# Patient Record
Sex: Female | Born: 1985 | Race: White | Hispanic: No | Marital: Single | Smoking: Former smoker
Health system: Southern US, Community
[De-identification: ages and names within clinical notes are randomized; demographics above are authoritative.]

## PROBLEM LIST (undated history)

## (undated) ENCOUNTER — Inpatient Hospital Stay (HOSPITAL_COMMUNITY): Payer: Self-pay

## (undated) DIAGNOSIS — N200 Calculus of kidney: Secondary | ICD-10-CM

## (undated) DIAGNOSIS — N390 Urinary tract infection, site not specified: Secondary | ICD-10-CM

## (undated) DIAGNOSIS — J329 Chronic sinusitis, unspecified: Secondary | ICD-10-CM

## (undated) DIAGNOSIS — K50012 Crohn's disease of small intestine with intestinal obstruction: Principal | ICD-10-CM

## (undated) DIAGNOSIS — D649 Anemia, unspecified: Secondary | ICD-10-CM

## (undated) DIAGNOSIS — G43909 Migraine, unspecified, not intractable, without status migrainosus: Secondary | ICD-10-CM

## (undated) DIAGNOSIS — I10 Essential (primary) hypertension: Secondary | ICD-10-CM

## (undated) DIAGNOSIS — F419 Anxiety disorder, unspecified: Secondary | ICD-10-CM

## (undated) DIAGNOSIS — J40 Bronchitis, not specified as acute or chronic: Secondary | ICD-10-CM

## (undated) HISTORY — PX: WISDOM TOOTH EXTRACTION: SHX21

## (undated) HISTORY — DX: Chronic sinusitis, unspecified: J40

## (undated) HISTORY — DX: Crohn's disease of small intestine with intestinal obstruction: K50.012

## (undated) HISTORY — DX: Migraine, unspecified, not intractable, without status migrainosus: G43.909

## (undated) HISTORY — DX: Chronic sinusitis, unspecified: J32.9

---

## 2006-12-24 ENCOUNTER — Ambulatory Visit (HOSPITAL_COMMUNITY): Admission: RE | Admit: 2006-12-24 | Discharge: 2006-12-24 | Payer: Self-pay | Admitting: Emergency Medicine

## 2011-03-10 ENCOUNTER — Inpatient Hospital Stay (HOSPITAL_COMMUNITY)
Admission: AD | Admit: 2011-03-10 | Discharge: 2011-03-10 | Disposition: A | Discharge status: Home / Self Care | Payer: Medicaid Other | Source: Ambulatory Visit | Attending: Obstetrics & Gynecology | Admitting: Obstetrics & Gynecology

## 2011-03-10 ENCOUNTER — Encounter (HOSPITAL_COMMUNITY): Payer: Self-pay

## 2011-03-10 DIAGNOSIS — O2390 Unspecified genitourinary tract infection in pregnancy, unspecified trimester: Secondary | ICD-10-CM | POA: Insufficient documentation

## 2011-03-10 DIAGNOSIS — R109 Unspecified abdominal pain: Secondary | ICD-10-CM | POA: Insufficient documentation

## 2011-03-10 DIAGNOSIS — A499 Bacterial infection, unspecified: Secondary | ICD-10-CM

## 2011-03-10 DIAGNOSIS — K30 Functional dyspepsia: Secondary | ICD-10-CM

## 2011-03-10 DIAGNOSIS — B9689 Other specified bacterial agents as the cause of diseases classified elsewhere: Secondary | ICD-10-CM | POA: Insufficient documentation

## 2011-03-10 DIAGNOSIS — N760 Acute vaginitis: Secondary | ICD-10-CM

## 2011-03-10 DIAGNOSIS — R1013 Epigastric pain: Secondary | ICD-10-CM

## 2011-03-10 HISTORY — DX: Anemia, unspecified: D64.9

## 2011-03-10 HISTORY — DX: Urinary tract infection, site not specified: N39.0

## 2011-03-10 LAB — URINALYSIS, ROUTINE W REFLEX MICROSCOPIC
Glucose, UA: NEGATIVE mg/dL
Ketones, ur: NEGATIVE mg/dL
Leukocytes, UA: NEGATIVE
pH: 6.5 (ref 5.0–8.0)

## 2011-03-10 LAB — URINE MICROSCOPIC-ADD ON

## 2011-03-10 LAB — WET PREP, GENITAL: Yeast Wet Prep HPF POC: NONE SEEN

## 2011-03-10 MED ORDER — GI COCKTAIL ~~LOC~~
30.0000 mL | Freq: Once | ORAL | Status: AC
  Administered 2011-03-10: 30 mL via ORAL
  Filled 2011-03-10: qty 30

## 2011-03-10 MED ORDER — METRONIDAZOLE 500 MG PO TABS: 500.0000 mg | ORAL_TABLET | Freq: Two times a day (BID) | ORAL | Status: AC

## 2011-03-10 NOTE — Progress Notes (Signed)
Patient states she started having mid to upper abdominal pain this morning that started with stretching. Comes and goes. Has not had prenatal care. No bleeding, leaking or unusual discharge.

## 2011-03-10 NOTE — ED Provider Notes (Signed)
History     Chief Complaint  Patient presents with  . Abdominal Pain   HPIJamie L Garcia is 25 y.o. G1P0 35w6dweeks presenting with epigastric, mid sternal pain X this am.  Ate cereal after pain began with nausea and vomiting. Feels that if she burped, the pain may resolve.  LMP 10/29/10,  [facility]  Very frustrated.  States they are "booked"when she calls back as instructed.  Denies any care for this pregnancy.  Wants to make sure baby is ok. 1 partner X 1 year.  Denies vaginal discharge or bleeding.      Past Medical History  Diagnosis Date  . Anemia   . Asthma     exercise induced  . Urinary tract infection   . Chronic kidney disease     kidney stones    Past Surgical History  Procedure Date  . No past surgeries     Family History  Problem Relation Age of Onset  . Anesthesia problems Neg Hx     History  Substance Use Topics  . Smoking status: Former SResearch scientist (life sciences) . Smokeless tobacco: Never Used  . Alcohol Use: No    Allergies: No Known Allergies  Prescriptions prior to admission  Medication Sig Dispense Refill  . acetaminophen (TYLENOL) 500 MG tablet Take 1,000 mg by mouth every 6 (six) hours as needed. Takes for headaches       . prenatal vitamin w/FE, FA (PRENATAL 1 + 1) 27-1 MG TABS Take 1 tablet by mouth daily.          Review of Systems  Gastrointestinal: Positive for heartburn and abdominal pain (mid sternal ). Negative for nausea, vomiting, diarrhea and constipation.  Genitourinary: Negative.        Negative for vaginal bleeding or discharge   Physical Exam   Blood pressure 126/68, pulse 93, temperature 98.4 F (36.9 C), temperature source Oral, resp. rate 16, height 5' 5.5" (1.664 m), weight 192 lb 3.2 oz (87.181 kg), last menstrual period 10/29/2010, SpO2 99.00%.  Physical Exam  Constitutional: She is oriented to person, place, and time. She appears well-developed and well-nourished. No distress.  HENT:   Head: Normocephalic.  Neck: Normal range of motion.  Cardiovascular: Normal rate.   Respiratory: Effort normal.  GI: Soft. She exhibits no distension. There is tenderness (epigastric). There is no rebound and no guarding.       Uterus-18 week size, nontender  Genitourinary: Uterus is enlarged (18 week size). Uterus is not tender. Cervix exhibits no motion tenderness, no discharge and no friability. No bleeding around the vagina. Vaginal discharge (malodorous, yellow in color) found.  Neurological: She is alert and oriented to person, place, and time.  Skin: Skin is warm and dry.   Results for orders placed during the hospital encounter of 03/10/11 (from the past 24 hour(s))  URINALYSIS, ROUTINE W REFLEX MICROSCOPIC     Status: Abnormal   Collection Time   03/10/11 10:20 AM      Component Value Range   Color, Urine YELLOW  YELLOW    APPearance CLOUDY (*) CLEAR    Specific Gravity, Urine 1.010  1.005 - 1.030    pH 6.5  5.0 - 8.0    Glucose, UA NEGATIVE  NEGATIVE (mg/dL)   Hgb urine dipstick TRACE (*) NEGATIVE    Bilirubin Urine NEGATIVE  NEGATIVE    Ketones, ur NEGATIVE  NEGATIVE (mg/dL)   Protein, ur NEGATIVE  NEGATIVE (mg/dL)  Urobilinogen, UA 0.2  0.0 - 1.0 (mg/dL)   Nitrite NEGATIVE  NEGATIVE    Leukocytes, UA NEGATIVE  NEGATIVE   URINE MICROSCOPIC-ADD ON     Status: Abnormal   Collection Time   03/10/11 10:20 AM      Component Value Range   Squamous Epithelial / LPF RARE  RARE    WBC, UA 0-2  <3 (WBC/hpf)   Bacteria, UA FEW (*) RARE    Urine-Other AMORPHOUS URATES/PHOSPHATES    WET PREP, GENITAL     Status: Abnormal   Collection Time   03/10/11 10:52 AM      Component Value Range   Yeast, Wet Prep NONE SEEN  NONE SEEN    Trich, Wet Prep NONE SEEN  NONE SEEN    Clue Cells, Wet Prep FEW (*) NONE SEEN    WBC, Wet Prep HPF POC TOO NUMEROUS TO COUNT (*) NONE SEEN    MAU Course  Procedures GC/CHL culture to lab GI cocktail ordered  Patient states pain is much improved  after GI cocktail.   Assessment and Plan  A:  Upper abdominal pain at 18w gestation      Bacterial vaginosis      Indigestion P:  Rx for Flagyl    Instructions to avoid fried, greasy foods.    Call [facility] to make appt for prenatal care. Kashtyn Jankowski,EVE M 03/10/2011, 10:45 AM   Belinda Fisher, NP 03/10/11 1216

## 2011-03-11 LAB — GC/CHLAMYDIA PROBE AMP, GENITAL: GC Probe Amp, Genital: NEGATIVE

## 2011-04-05 NOTE — L&D Delivery Note (Signed)
Delivery Note At 9:47 PM a viable female was delivered via Vaginal, Spontaneous Delivery (Presentation: Right Occiput Anterior).  APGAR: 9, 10; weight .   Placenta status: Intact, Spontaneous.  Cord: 3 vessels with the following complications: None.  Cord pH: NA  Nuchal cord x1  Anesthesia: Lidocaine, local Episiotomy: None Lacerations: 2nd degree Suture Repair: 3.0 Monocryl Est. Blood Loss (mL): 300  Mom to postpartum.  Baby to nursery-stable.  [place] 07/30/2011, 10:27 PM    I was present for delivery and agree with note above. [facility]

## 2011-04-18 ENCOUNTER — Other Ambulatory Visit (HOSPITAL_COMMUNITY): Payer: Self-pay | Admitting: Physician Assistant

## 2011-04-18 DIAGNOSIS — Z3689 Encounter for other specified antenatal screening: Secondary | ICD-10-CM

## 2011-04-21 ENCOUNTER — Ambulatory Visit (HOSPITAL_COMMUNITY)
Admission: RE | Admit: 2011-04-21 | Discharge: 2011-04-21 | Disposition: A | Discharge status: Home / Self Care | Payer: Medicaid Other | Source: Ambulatory Visit | Attending: Physician Assistant | Admitting: Physician Assistant

## 2011-04-21 DIAGNOSIS — O0930 Supervision of pregnancy with insufficient antenatal care, unspecified trimester: Secondary | ICD-10-CM | POA: Insufficient documentation

## 2011-04-21 DIAGNOSIS — Z1389 Encounter for screening for other disorder: Secondary | ICD-10-CM | POA: Insufficient documentation

## 2011-04-21 DIAGNOSIS — O358XX0 Maternal care for other (suspected) fetal abnormality and damage, not applicable or unspecified: Secondary | ICD-10-CM | POA: Insufficient documentation

## 2011-04-21 DIAGNOSIS — Z3689 Encounter for other specified antenatal screening: Secondary | ICD-10-CM

## 2011-04-21 DIAGNOSIS — Z363 Encounter for antenatal screening for malformations: Secondary | ICD-10-CM | POA: Insufficient documentation

## 2011-05-21 ENCOUNTER — Inpatient Hospital Stay (HOSPITAL_COMMUNITY)
Admission: AD | Admit: 2011-05-21 | Discharge: 2011-05-21 | Disposition: A | Discharge status: Home / Self Care | Payer: Medicaid Other | Source: Ambulatory Visit | Attending: Obstetrics & Gynecology | Admitting: Obstetrics & Gynecology

## 2011-05-21 ENCOUNTER — Encounter (HOSPITAL_COMMUNITY): Payer: Self-pay | Admitting: *Deleted

## 2011-05-21 DIAGNOSIS — B9689 Other specified bacterial agents as the cause of diseases classified elsewhere: Secondary | ICD-10-CM | POA: Insufficient documentation

## 2011-05-21 DIAGNOSIS — O479 False labor, unspecified: Secondary | ICD-10-CM

## 2011-05-21 DIAGNOSIS — N760 Acute vaginitis: Secondary | ICD-10-CM

## 2011-05-21 DIAGNOSIS — O4690 Antepartum hemorrhage, unspecified, unspecified trimester: Secondary | ICD-10-CM | POA: Insufficient documentation

## 2011-05-21 DIAGNOSIS — A499 Bacterial infection, unspecified: Secondary | ICD-10-CM | POA: Insufficient documentation

## 2011-05-21 DIAGNOSIS — O2390 Unspecified genitourinary tract infection in pregnancy, unspecified trimester: Secondary | ICD-10-CM | POA: Insufficient documentation

## 2011-05-21 HISTORY — DX: Calculus of kidney: N20.0

## 2011-05-21 LAB — URINALYSIS, ROUTINE W REFLEX MICROSCOPIC
Bilirubin Urine: NEGATIVE
Nitrite: NEGATIVE
Protein, ur: NEGATIVE mg/dL
Urobilinogen, UA: 0.2 mg/dL (ref 0.0–1.0)

## 2011-05-21 MED ORDER — METRONIDAZOLE 0.75 % EX GEL: Freq: Every day | CUTANEOUS | Status: DC

## 2011-05-21 NOTE — ED Provider Notes (Signed)
Attestation of Attending Supervision of Advanced Practitioner: Evaluation and management procedures were performed by the PA/NP/CNM/OB Fellow under my supervision/collaboration. Chart reviewed, and agree with management and plan.  Verita Schneiders, M.D. 05/21/2011 6:03 PM

## 2011-05-21 NOTE — Progress Notes (Signed)
Pt stated she had some pink/blood in her urine this morning x2. Nothing since. Denies pain or discomfort.

## 2011-05-21 NOTE — Progress Notes (Signed)
Saw blood in toilet.

## 2011-05-21 NOTE — ED Provider Notes (Signed)
Chief Complaint:  Vaginal Bleeding   Jocelyn Garcia is  26 y.o. G1P0 at 65w2dpresents complaining of Vaginal Bleeding .  She states none contractions., intact membranes, along with active fetal movement. She saw pink in the toilett after urinating   Obstetrical/Gynecological History: Menstrual History: OB History    Grav Para Term Preterm Abortions TAB SAB Ect Mult Living   1               Patient's last menstrual period was 10/29/2010.     Past Medical History: Past Medical History  Diagnosis Date  . Anemia   . Asthma     exercise induced  . Urinary tract infection   . Chronic kidney disease   . Kidney stones     Past Surgical History: Past Surgical History  Procedure Date  . Wisdom tooth extraction     Family History: Family History  Problem Relation Age of Onset  . Anesthesia problems Neg Hx     Social History: History  Substance Use Topics  . Smoking status: Former SResearch scientist (life sciences) . Smokeless tobacco: Never Used  . Alcohol Use: No    Allergies: No Known Allergies  Meds:  Prescriptions prior to admission  Medication Sig Dispense Refill  . acetaminophen (TYLENOL) 500 MG tablet Take 1,000 mg by mouth every 6 (six) hours as needed. Takes for headaches       . albuterol (PROVENTIL HFA;VENTOLIN HFA) 108 (90 BASE) MCG/ACT inhaler Inhale 2 puffs into the lungs every 6 (six) hours as needed. For shortness of breath      . calcium carbonate (TUMS EX) 750 MG chewable tablet Chew 1 tablet by mouth daily. For heartburn      . prenatal vitamin w/FE, FA (PRENATAL 1 + 1) 27-1 MG TABS Take 1 tablet by mouth daily.          Review of Systems - Please refer to the aforementioned patients' reports.     Physical Exam  Blood pressure 128/73, pulse 96, temperature 99 F (37.2 C), temperature source Oral, resp. rate 18, height 5' 5"  (1.651 m), weight 217 lb (98.431 kg), last menstrual period 10/29/2010. GENERAL: Well-developed, well-nourished female in no acute distress.    LUNGS: Clear to auscultation bilaterally.  HEART: Regular rate and rhythm. ABDOMEN: Soft, nontender, nondistended, gravid.  EXTREMITIES: Nontender, no edema, 2+ distal pulses. CERVICAL EXAM: Dilatation 0cm   Effacement 0%   Station -3   Presentation: unsure SSE:  No blood in vagina. Thin white discharger.  Cx very friable.  Wet prep  + clue cells FHT:  Baseline rate 140 bpm   Variability moderate  Accelerations present   Decelerations none Contractions: Every 0 mins   Labs: Recent Results (from the past 24 hour(s))  URINALYSIS, ROUTINE W REFLEX MICROSCOPIC   Collection Time   05/21/11  3:30 PM      Component Value Range   Color, Urine YELLOW  YELLOW    APPearance CLEAR  CLEAR    Specific Gravity, Urine <1.005 (*) 1.005 - 1.030    pH 6.0  5.0 - 8.0    Glucose, UA NEGATIVE  NEGATIVE (mg/dL)   Hgb urine dipstick LARGE (*) NEGATIVE    Bilirubin Urine NEGATIVE  NEGATIVE    Ketones, ur NEGATIVE  NEGATIVE (mg/dL)   Protein, ur NEGATIVE  NEGATIVE (mg/dL)   Urobilinogen, UA 0.2  0.0 - 1.0 (mg/dL)   Nitrite NEGATIVE  NEGATIVE    Leukocytes, UA SMALL (*) NEGATIVE   URINE MICROSCOPIC-ADD ON  Collection Time   05/21/11  3:30 PM      Component Value Range   Squamous Epithelial / LPF RARE  RARE    WBC, UA 3-6  <3 (WBC/hpf)   RBC / HPF 7-10  <3 (RBC/hpf)   Bacteria, UA FEW (*) RARE    Imaging Studies:  No results found.  Assessment: [name] is  26 y.o. G1P0 at 63w2dpresents with cervical friability 2/2  Bacterial vaginosis.  Plan: Treat BV  CRESENZO-DISHMAN,Valkyrie Guardiola 2/16/20135:13 PM

## 2011-05-21 NOTE — Discharge Instructions (Signed)
Bacterial Vaginosis Bacterial vaginosis (BV) is a vaginal infection where the normal balance of bacteria in the vagina is disrupted. The normal balance is then replaced by an overgrowth of certain bacteria. There are several different kinds of bacteria that can cause BV. BV is the most common vaginal infection in women of childbearing age. CAUSES   The cause of BV is not fully understood. BV develops when there is an increase or imbalance of harmful bacteria.   Some activities or behaviors can upset the normal balance of bacteria in the vagina and put women at increased risk including:   Having a new sex partner or multiple sex partners.   Douching.   Using an intrauterine device (IUD) for contraception.   It is not clear what role sexual activity plays in the development of BV. However, women that have never had sexual intercourse are rarely infected with BV.  Women do not get BV from toilet seats, bedding, swimming pools or from touching objects around them.  SYMPTOMS   Grey vaginal discharge.   A fish-like odor with discharge, especially after sexual intercourse.   Itching or burning of the vagina and vulva.   Burning or pain with urination.   Some women have no signs or symptoms at all.  DIAGNOSIS  Your caregiver must examine the vagina for signs of BV. Your caregiver will perform lab tests and look at the sample of vaginal fluid through a microscope. They will look for bacteria and abnormal cells (clue cells), a pH test higher than 4.5, and a positive amine test all associated with BV.  RISKS AND COMPLICATIONS   Pelvic inflammatory disease (PID).   Infections following gynecology surgery.   Developing HIV.   Developing herpes virus.  TREATMENT  Sometimes BV will clear up without treatment. However, all women with symptoms of BV should be treated to avoid complications, especially if gynecology surgery is planned. Female partners generally do not need to be treated. However,  BV may spread between female sex partners so treatment is helpful in preventing a recurrence of BV.   BV may be treated with antibiotics. The antibiotics come in either pill or vaginal cream forms. Either can be used with nonpregnant or pregnant women, but the recommended dosages differ. These antibiotics are not harmful to the baby.   BV can recur after treatment. If this happens, a second round of antibiotics will often be prescribed.   Treatment is important for pregnant women. If not treated, BV can cause a premature delivery, especially for a pregnant woman who had a premature birth in the past. All pregnant women who have symptoms of BV should be checked and treated.   For chronic reoccurrence of BV, treatment with a type of prescribed gel vaginally twice a week is helpful.  HOME CARE INSTRUCTIONS   Finish all medication as directed by your caregiver.   Do not have sex until treatment is completed.   Tell your sexual partner that you have a vaginal infection. They should see their caregiver and be treated if they have problems, such as a mild rash or itching.   Practice safe sex. Use condoms. Only have 1 sex partner.  PREVENTION  Basic prevention steps can help reduce the risk of upsetting the natural balance of bacteria in the vagina and developing BV:  Do not have sexual intercourse (be abstinent).   Do not douche.   Use all of the medicine prescribed for treatment of BV, even if the signs and symptoms go away.  Tell your sex partner if you have BV. That way, they can be treated, if needed, to prevent reoccurrence.  SEEK MEDICAL CARE IF:   Your symptoms are not improving after 3 days of treatment.   You have increased discharge, pain, or fever.  MAKE SURE YOU:   Understand these instructions.   Will watch your condition.   Will get help right away if you are not doing well or get worse.  FOR MORE INFORMATION  Division of STD Prevention (DSTDP), Centers for Disease  Control and Prevention: [employer] [place] (ASHA): www.ashastd.org  Document Released: 03/21/2005 Document Revised: 12/01/2010 Document Reviewed: 09/11/2008 [facility] Patient Information 2012 [place].

## 2011-05-23 LAB — URINE CULTURE
Colony Count: 25000
Special Requests: NORMAL

## 2011-06-22 ENCOUNTER — Other Ambulatory Visit (HOSPITAL_COMMUNITY): Payer: Self-pay | Admitting: Physician Assistant

## 2011-06-22 DIAGNOSIS — R319 Hematuria, unspecified: Secondary | ICD-10-CM

## 2011-06-24 ENCOUNTER — Ambulatory Visit (HOSPITAL_COMMUNITY)
Admission: RE | Admit: 2011-06-24 | Discharge: 2011-06-24 | Disposition: A | Discharge status: Home / Self Care | Payer: Medicaid Other | Source: Ambulatory Visit | Attending: Physician Assistant | Admitting: Physician Assistant

## 2011-06-24 DIAGNOSIS — O99891 Other specified diseases and conditions complicating pregnancy: Secondary | ICD-10-CM | POA: Insufficient documentation

## 2011-06-24 DIAGNOSIS — R319 Hematuria, unspecified: Secondary | ICD-10-CM | POA: Insufficient documentation

## 2011-07-16 HISTORY — PX: CT CTA CORONARY W/CA SCORE W/CM &/OR WO/CM: HXRAD787

## 2011-07-29 ENCOUNTER — Inpatient Hospital Stay (HOSPITAL_COMMUNITY)
Admission: AD | Admit: 2011-07-29 | Discharge: 2011-08-01 | DRG: 775 | Disposition: A | Discharge status: Home / Self Care | Payer: Medicaid Other | Source: Ambulatory Visit | Attending: Obstetrics & Gynecology | Admitting: Obstetrics & Gynecology

## 2011-07-29 ENCOUNTER — Encounter (HOSPITAL_COMMUNITY): Payer: Self-pay | Admitting: *Deleted

## 2011-07-29 DIAGNOSIS — O139 Gestational [pregnancy-induced] hypertension without significant proteinuria, unspecified trimester: Principal | ICD-10-CM | POA: Diagnosis present

## 2011-07-29 LAB — PROTEIN / CREATININE RATIO, URINE: Protein Creatinine Ratio: 0.11 (ref 0.00–0.15)

## 2011-07-29 LAB — COMPREHENSIVE METABOLIC PANEL
ALT: 12 U/L (ref 0–35)
AST: 10 U/L (ref 0–37)
Albumin: 2.8 g/dL — ABNORMAL LOW (ref 3.5–5.2)
Calcium: 9.1 mg/dL (ref 8.4–10.5)
Sodium: 138 mEq/L (ref 135–145)
Total Protein: 6.3 g/dL (ref 6.0–8.3)

## 2011-07-29 LAB — CBC
MCH: 27.9 pg (ref 26.0–34.0)
MCHC: 32.4 g/dL (ref 30.0–36.0)
Platelets: 204 10*3/uL (ref 150–400)

## 2011-07-29 LAB — OB RESULTS CONSOLE HIV ANTIBODY (ROUTINE TESTING): HIV: NONREACTIVE

## 2011-07-29 LAB — OB RESULTS CONSOLE GBS: GBS: NEGATIVE

## 2011-07-29 LAB — OB RESULTS CONSOLE RPR: RPR: NONREACTIVE

## 2011-07-29 LAB — OB RESULTS CONSOLE GC/CHLAMYDIA: Gonorrhea: NEGATIVE

## 2011-07-29 MED ORDER — LACTATED RINGERS IV SOLN
500.0000 mL | INTRAVENOUS | Status: DC | PRN
  Administered 2011-07-29: 1000 mL via INTRAVENOUS

## 2011-07-29 MED ORDER — FLEET ENEMA 7-19 GM/118ML RE ENEM: 1.0000 | ENEMA | RECTAL | Status: DC | PRN

## 2011-07-29 MED ORDER — MISOPROSTOL 25 MCG QUARTER TABLET
25.0000 ug | ORAL_TABLET | ORAL | Status: DC | PRN
  Administered 2011-07-29 – 2011-07-30 (×4): 25 ug via VAGINAL
  Filled 2011-07-29 (×4): qty 0.25

## 2011-07-29 MED ORDER — OXYTOCIN BOLUS FROM INFUSION
500.0000 mL | Freq: Once | INTRAVENOUS | Status: DC
  Filled 2011-07-29: qty 500

## 2011-07-29 MED ORDER — ZOLPIDEM TARTRATE 10 MG PO TABS
10.0000 mg | ORAL_TABLET | Freq: Every evening | ORAL | Status: DC | PRN
  Administered 2011-07-29: 10 mg via ORAL
  Filled 2011-07-29: qty 1

## 2011-07-29 MED ORDER — LABETALOL HCL 200 MG PO TABS
200.0000 mg | ORAL_TABLET | Freq: Two times a day (BID) | ORAL | Status: DC
  Administered 2011-07-29 – 2011-07-30 (×3): 200 mg via ORAL
  Filled 2011-07-29 (×5): qty 1

## 2011-07-29 MED ORDER — TERBUTALINE SULFATE 1 MG/ML IJ SOLN: 0.2500 mg | Freq: Once | INTRAMUSCULAR | Status: AC | PRN

## 2011-07-29 MED ORDER — LIDOCAINE HCL (PF) 1 % IJ SOLN
30.0000 mL | INTRAMUSCULAR | Status: DC | PRN
  Administered 2011-07-30: 30 mL via SUBCUTANEOUS
  Filled 2011-07-29: qty 30

## 2011-07-29 MED ORDER — ZOLPIDEM TARTRATE 5 MG PO TABS: 5.0000 mg | ORAL_TABLET | Freq: Every evening | ORAL | Status: DC | PRN

## 2011-07-29 MED ORDER — CITRIC ACID-SODIUM CITRATE 334-500 MG/5ML PO SOLN
30.0000 mL | ORAL | Status: DC | PRN
  Administered 2011-07-29: 30 mL via ORAL
  Filled 2011-07-29: qty 15

## 2011-07-29 MED ORDER — LABETALOL HCL 5 MG/ML IV SOLN
10.0000 mg | INTRAVENOUS | Status: DC | PRN
  Administered 2011-07-29: 10 mg via INTRAVENOUS
  Filled 2011-07-29: qty 4

## 2011-07-29 MED ORDER — ACETAMINOPHEN 325 MG PO TABS: 650.0000 mg | ORAL_TABLET | ORAL | Status: DC | PRN

## 2011-07-29 MED ORDER — IBUPROFEN 600 MG PO TABS: 600.0000 mg | ORAL_TABLET | Freq: Four times a day (QID) | ORAL | Status: DC | PRN

## 2011-07-29 MED ORDER — OXYCODONE-ACETAMINOPHEN 5-325 MG PO TABS: 1.0000 | ORAL_TABLET | ORAL | Status: DC | PRN

## 2011-07-29 MED ORDER — ONDANSETRON HCL 4 MG/2ML IJ SOLN
4.0000 mg | Freq: Four times a day (QID) | INTRAMUSCULAR | Status: DC | PRN
  Administered 2011-07-30 (×2): 4 mg via INTRAVENOUS
  Filled 2011-07-29 (×2): qty 2

## 2011-07-29 MED ORDER — LABETALOL HCL 5 MG/ML IV SOLN: 10.0000 mg | INTRAVENOUS | Status: DC | PRN

## 2011-07-29 MED ORDER — OXYTOCIN 20 UNITS IN LACTATED RINGERS INFUSION - SIMPLE
125.0000 mL/h | Freq: Once | INTRAVENOUS | Status: AC
  Administered 2011-07-30: 125 mL/h via INTRAVENOUS

## 2011-07-29 MED ORDER — LACTATED RINGERS IV SOLN
INTRAVENOUS | Status: DC
  Administered 2011-07-30 (×3): via INTRAVENOUS

## 2011-07-29 NOTE — Progress Notes (Addendum)
AVALIN BRILEY is a 26 y.o. G1P0 at [redacted]w[redacted]d  Subjective: Doing well. No complaints  Objective: BP 138/71  Pulse 84  Temp(Src) 98.1 F (36.7 C) (Oral)  Resp 18  Ht 5' 5"  (1.651 m)  Wt 99.791 kg (220 lb)  BMI 36.61 kg/m2  LMP 10/29/2010      FHT:  FHR: 140s bpm, variability: moderate,  accelerations:  Present,  decelerations:  Absent UC:   irregular, every 8 minutes SVE:   Dilation: 2 Effacement (%): 30 Station: -3 Exam by:: Nneka Blanda MD  Labs: Lab Results  Component Value Date   WBC 10.1 07/29/2011   HGB 9.4* 07/29/2011   HCT 29.0* 07/29/2011   MCV 86.1 07/29/2011   PLT 204 07/29/2011    Assessment / Plan: Induction of labor due to gestational hypertension,  progressing well on pitocin Continue cytotec. Foley bulb placed  Labor: Progressing normally Fetal Wellbeing:  Category I Pain Control:  Labor support without medications I/D:  n/a Anticipated MOD:  NSVD  Jaizon Deroos 07/29/2011, 11:06 PM

## 2011-07-29 NOTE — Progress Notes (Signed)
Jocelyn Garcia is a 26 y.o. G1P0 at 61w1dby ultrasound admitted for induction of labor due to Hypertension.  Subjective: No complaints. Does not want epidural. Would like to avoid pitocin - worried about contractions.  Objective: BP 138/71  Pulse 84  Temp(Src) 98.1 F (36.7 C) (Oral)  Resp 20  Ht 5' 5"  (1.651 m)  Wt 99.791 kg (220 lb)  BMI 36.61 kg/m2  LMP 10/29/2010      FHT:  FHR: 140 bpm, variability: moderate,  accelerations:  Present,  decelerations:  Absent UC:   regular, every 4 minutes SVE:   Dilation: 1 Effacement (%): 20 Station: -3 Exam by:: Richar Dunklee,MD  Labs: Lab Results  Component Value Date   WBC 10.1 07/29/2011   HGB 9.4* 07/29/2011   HCT 29.0* 07/29/2011   MCV 86.1 07/29/2011   PLT 204 07/29/2011    Assessment / Plan: Induction of labor due to gestational hypertension,  progressing well on pitocin  Labor: Progressing normally s/p cytotecx2 Preeclampsia:  no signs or symptoms of toxicity Fetal Wellbeing:  Category I Pain Control:  Labor support without medications I/D:  n/a Anticipated MOD:  NSVD  Jocelyn Scantling MD 07/29/2011, 7:42 PM

## 2011-07-29 NOTE — H&P (Signed)
Agree with above note.  Jocelyn Blow H. 07/29/2011 9:35 PM

## 2011-07-29 NOTE — H&P (Signed)
Jocelyn Garcia is a 26 y.o. female presenting with HTN @ 40.1 weeks for induction Maternal Medical History:  Reason for admission: HTN   Contractions: Frequency: rare.   Perceived severity is mild.    Fetal activity: Perceived fetal activity is normal.   Last perceived fetal movement was within the past hour.    Prenatal complications: Hypertension.   Prenatal Complications - Diabetes: none.    OB History    Grav Para Term Preterm Abortions TAB SAB Ect Mult Living   1              Past Medical History  Diagnosis Date  . Anemia   . Asthma     exercise induced  . Urinary tract infection   . Chronic kidney disease   . Kidney stones    Past Surgical History  Procedure Date  . Wisdom tooth extraction    Family History: family history is negative for Anesthesia problems. Social History:  reports that she has quit smoking. She has never used smokeless tobacco. She reports that she uses illicit drugs (Marijuana). She reports that she does not drink alcohol.  ROS Pertinent items are noted in HPI.  Dilation: 1 Effacement (%): 20 Station: -3 Exam by:: Caden Fatica,MD Blood pressure 146/90, pulse 83, temperature 98.1 F (36.7 C), temperature source Oral, resp. rate 18, height 5' 5"  (1.651 m), weight 99.791 kg (220 lb), last menstrual period 10/29/2010. Exam Physical Exam  General: pleasant c/f, nad, relaxed.  Neurologic exam : Cn 2-7 intact Left radial reflex 1+  Neck:  No deformities, thyromegaly, masses, or tenderness noted.   Supple with full range of motion without pain. Lungs:  Normal respiratory effort, chest expands symmetrically. Lungs are clear to auscultation, no crackles or wheezes. Heart - Regular rate and rhythm.  No murmurs, gallops or rubs.    Abdomen: soft and non-tender without masses, organomegaly or hernias noted.  No guarding or rebound Extremities:   Non-tender, No cyanosis, edema, or deformity noted.  Dilation: 1 Effacement (%): 20 Station:  -3 Presentation: Vertex Exam by:: Tijah Hane,MD Intact  Prenatal labs: ABO, Rh: A/Positive/-- (04/26 1254) Antibody: Negative (04/26 1254) Rubella: Immune (04/26 1254) RPR: Nonreactive (04/26 1254)  HBsAg: Negative (04/26 1254)  HIV: Non-reactive (04/26 1254)  GBS: Negative (04/26 1254)   Assessment/Plan: 26 y/o c/f G1P0 @ 40.1 with HTN  1. Infuction of labor for HTN BP Readings from Last 3 Encounters:  07/29/11 146/90  05/21/11 128/73  03/10/11 129/65  no right upper quadrant pain. Reflexes normal. No visual changes.  -will start with cervical ripening with cytotec 25 transvaginally.  Does not want epidural or pitocin.  Lyndee Hensen MD 07/29/2011, 2:00 PM

## 2011-07-30 ENCOUNTER — Encounter (HOSPITAL_COMMUNITY): Payer: Self-pay | Admitting: *Deleted

## 2011-07-30 DIAGNOSIS — O139 Gestational [pregnancy-induced] hypertension without significant proteinuria, unspecified trimester: Secondary | ICD-10-CM

## 2011-07-30 LAB — RPR: RPR Ser Ql: NONREACTIVE

## 2011-07-30 MED ORDER — BUTORPHANOL TARTRATE 2 MG/ML IJ SOLN
1.0000 mg | INTRAMUSCULAR | Status: DC | PRN
  Administered 2011-07-30 (×2): 1 mg via INTRAVENOUS
  Filled 2011-07-30 (×2): qty 1

## 2011-07-30 MED ORDER — LABETALOL HCL 5 MG/ML IV SOLN: 10.0000 mg | Freq: Once | INTRAVENOUS | Status: DC

## 2011-07-30 MED ORDER — OXYTOCIN 20 UNITS IN LACTATED RINGERS INFUSION - SIMPLE
1.0000 m[IU]/min | INTRAVENOUS | Status: DC
  Administered 2011-07-30: 2 m[IU]/min via INTRAVENOUS
  Filled 2011-07-30: qty 1000

## 2011-07-30 MED ORDER — TERBUTALINE SULFATE 1 MG/ML IJ SOLN: 0.2500 mg | Freq: Once | INTRAMUSCULAR | Status: AC | PRN

## 2011-07-30 NOTE — Progress Notes (Addendum)
RN updated Rogue Jury, CNM of patient's labor status: tachystole, contraction pattern, patient's pain level, and fetal heart baseline, variability, and accelerations. No orders given at this time. CNM instructed to keep pitocin at 92mllitunits/minute. RN will continue to order.

## 2011-07-30 NOTE — Progress Notes (Signed)
Tracing maternal

## 2011-07-30 NOTE — Progress Notes (Addendum)
Tracing maternal with patient on her right side.

## 2011-07-30 NOTE — Progress Notes (Signed)
   Subjective: Pt reports increased pain with contractions.  Denies headache or epigastric pain.  No questions or concerns.   Objective: BP 129/71  Pulse 78  Temp(Src) 97.4 F (36.3 C) (Oral)  Resp 22  Ht 5' 5"  (1.651 m)  Wt 99.791 kg (220 lb)  BMI 36.61 kg/m2  LMP 10/29/2010      FHT:  FHR: 130's bpm, variability: moderate,  accelerations:  Present,  decelerations:  Present variable. UC:   regular, every 2-3 minutes SVE:   Dilation: 4.5 Effacement (%): 60 Station: -1 Exam by:: Sinda Du, RN  Labs: Lab Results  Component Value Date   WBC 10.1 07/29/2011   HGB 9.4* 07/29/2011   HCT 29.0* 07/29/2011   MCV 86.1 07/29/2011   PLT 204 07/29/2011    Assessment / Plan: Induction of labor due to gestational hypertension,  progressing well on pitocin  Labor: Cervix remains unchanged. Preeclampsia:  labs stable Fetal Wellbeing:  Category II Pain Control:  Labor support without medications I/D:  n/a Anticipated MOD:  NSVD Continue Pitocin  [facility] 07/30/2011, 12:15 PM

## 2011-07-30 NOTE — Progress Notes (Signed)
   Subjective: Nurse reports spontaneous rupture of membranes, thin meconium; pt reports increased pain with contractions.    Objective: BP 169/89  Pulse 72  Temp(Src) 98 F (36.7 C) (Oral)  Resp 24  Ht 5' 5"  (1.651 m)  Wt 99.791 kg (220 lb)  BMI 36.61 kg/m2  LMP 10/29/2010      FHT:  FHR: 130's bpm, variability: moderate,  accelerations:  Present,  decelerations:  Present variables UC:   regular, every 1-3 minutes SVE:   Dilation: 6 Effacement (%): 80 Station: 0 Exam by:: Montey Hora, RN  Labs: Lab Results  Component Value Date   WBC 10.1 07/29/2011   HGB 9.4* 07/29/2011   HCT 29.0* 07/29/2011   MCV 86.1 07/29/2011   PLT 204 07/29/2011    Assessment / Plan: Augmentation of labor, progressing well  Labor: Progressing normally Preeclampsia:  labs stable Fetal Wellbeing:  Category II Pain Control:  Labor support without medications I/D:  n/a Anticipated MOD:  NSVD  [facility] 07/30/2011, 7:10 PM

## 2011-07-30 NOTE — Progress Notes (Signed)
Jocelyn Garcia is a 26 y.o. G1P0 at [redacted]w[redacted]d  Subjective: Doing well. Sleeping comfortably. Foley bulb came out.   Objective: BP 125/74  Pulse 90  Temp(Src) 98 F (36.7 C) (Oral)  Resp 14  Ht 5' 5"  (1.651 m)  Wt 99.791 kg (220 lb)  BMI 36.61 kg/m2  LMP 10/29/2010      FHT:  FHR: 150s bpm, variability: moderate,  accelerations:  Present,  decelerations:  Absent UC:   irregular, every 3-5 minutes  SVE:   Dilation: 4.5 Effacement (%): 60 Station: -3 Exam by:: CSinda Du RN  Labs: Lab Results  Component Value Date   WBC 10.1 07/29/2011   HGB 9.4* 07/29/2011   HCT 29.0* 07/29/2011   MCV 86.1 07/29/2011   PLT 204 07/29/2011    Assessment / Plan: Induction of labor due to gestational hypertension,  progressing well. Continue w/ Cytotec  Labor: Progressing normally Fetal Wellbeing:  Category I Pain Control:  Labor support without medications I/D:  n/a Anticipated MOD:  NSVD  [name] 07/30/2011, 3:17 AM

## 2011-07-30 NOTE — Progress Notes (Signed)
RN talked to Jocelyn Garcia, CNM about patient's labor status. RN notified her of frequent contraction pattern and pitocin at 4 milliunits/minute. No orders given at this time. RN will continue to monitor.

## 2011-07-30 NOTE — Progress Notes (Signed)
RN and Mora Appl, CNM discussed patient's plan of care. CNM instructed RN to continue increasing pitocin.

## 2011-07-30 NOTE — Progress Notes (Signed)
Jocelyn Garcia is a 26 y.o. G1P0 at [redacted]w[redacted]d  Subjective: No complaints.   Objective: BP 123/63  Pulse 84  Temp(Src) 98.1 F (36.7 C) (Oral)  Resp 22  Ht 5' 5"  (1.651 m)  Wt 99.791 kg (220 lb)  BMI 36.61 kg/m2  LMP 10/29/2010      FHT:  FHR: 140s bpm, variability: moderate,  accelerations:  Present,  decelerations:  Absent UC:   irregular, every 4-6 minutes  SVE:   Dilation: 4.5 Effacement (%): 60 Station: -1 Exam by:: DLinna DarnerMD  Labs: Lab Results  Component Value Date   WBC 10.1 07/29/2011   HGB 9.4* 07/29/2011   HCT 29.0* 07/29/2011   MCV 86.1 07/29/2011   PLT 204 07/29/2011    Assessment / Plan: Spontaneous labor, progressing normally  Labor: Progressing slowly. Will Start Pit Fetal Wellbeing:  Category I Pain Control:  Labor support without medications I/D:  n/a Anticipated MOD:  NSVD  Elim Economou 07/30/2011, 7:56 AM

## 2011-07-31 LAB — CBC
Platelets: 175 10*3/uL (ref 150–400)
RBC: 2.99 MIL/uL — ABNORMAL LOW (ref 3.87–5.11)
WBC: 15.7 10*3/uL — ABNORMAL HIGH (ref 4.0–10.5)

## 2011-07-31 MED ORDER — SENNOSIDES-DOCUSATE SODIUM 8.6-50 MG PO TABS
2.0000 | ORAL_TABLET | Freq: Every day | ORAL | Status: DC
  Administered 2011-07-31: 2 via ORAL

## 2011-07-31 MED ORDER — TETANUS-DIPHTH-ACELL PERTUSSIS 5-2.5-18.5 LF-MCG/0.5 IM SUSP
0.5000 mL | Freq: Once | INTRAMUSCULAR | Status: AC
  Administered 2011-07-31: 0.5 mL via INTRAMUSCULAR

## 2011-07-31 MED ORDER — PRENATAL MULTIVITAMIN CH
1.0000 | ORAL_TABLET | Freq: Every day | ORAL | Status: DC
  Administered 2011-07-31 – 2011-08-01 (×2): 1 via ORAL
  Filled 2011-07-31 (×2): qty 1

## 2011-07-31 MED ORDER — DIPHENHYDRAMINE HCL 25 MG PO CAPS: 25.0000 mg | ORAL_CAPSULE | Freq: Four times a day (QID) | ORAL | Status: DC | PRN

## 2011-07-31 MED ORDER — BENZOCAINE-MENTHOL 20-0.5 % EX AERO
1.0000 "application " | INHALATION_SPRAY | CUTANEOUS | Status: DC | PRN
  Administered 2011-07-31: 1 via TOPICAL
  Filled 2011-07-31: qty 56

## 2011-07-31 MED ORDER — LANOLIN HYDROUS EX OINT: TOPICAL_OINTMENT | CUTANEOUS | Status: DC | PRN

## 2011-07-31 MED ORDER — ONDANSETRON HCL 4 MG PO TABS: 4.0000 mg | ORAL_TABLET | ORAL | Status: DC | PRN

## 2011-07-31 MED ORDER — IBUPROFEN 600 MG PO TABS
600.0000 mg | ORAL_TABLET | Freq: Four times a day (QID) | ORAL | Status: DC
  Administered 2011-07-31 – 2011-08-01 (×5): 600 mg via ORAL
  Filled 2011-07-31 (×5): qty 1

## 2011-07-31 MED ORDER — SIMETHICONE 80 MG PO CHEW: 80.0000 mg | CHEWABLE_TABLET | ORAL | Status: DC | PRN

## 2011-07-31 MED ORDER — ONDANSETRON HCL 4 MG/2ML IJ SOLN: 4.0000 mg | INTRAMUSCULAR | Status: DC | PRN

## 2011-07-31 MED ORDER — ZOLPIDEM TARTRATE 5 MG PO TABS: 5.0000 mg | ORAL_TABLET | Freq: Every evening | ORAL | Status: DC | PRN

## 2011-07-31 MED ORDER — WITCH HAZEL-GLYCERIN EX PADS: 1.0000 "application " | MEDICATED_PAD | CUTANEOUS | Status: DC | PRN

## 2011-07-31 MED ORDER — OXYCODONE-ACETAMINOPHEN 5-325 MG PO TABS: 1.0000 | ORAL_TABLET | ORAL | Status: DC | PRN

## 2011-07-31 MED ORDER — DIBUCAINE 1 % RE OINT: 1.0000 "application " | TOPICAL_OINTMENT | RECTAL | Status: DC | PRN

## 2011-07-31 NOTE — Progress Notes (Addendum)
Post Partum Day 1 Subjective: no complaints, up ad lib, voiding, tolerating PO, + flatus and No BM  Objective: Blood pressure 136/80, pulse 100, temperature 98.6 F (37 C), temperature source Oral, resp. rate 20, height 5' 5"  (1.651 m), weight 99.791 kg (220 lb), last menstrual period 10/29/2010, SpO2 98.00%, unknown if currently breastfeeding.  Physical Exam:  General: alert, cooperative and no distress Lochia: appropriate Uterine Fundus: firm DVT Evaluation: No evidence of DVT seen on physical exam.   Basename 07/31/11 0557 07/29/11 1518  HGB 8.4* 9.4*  HCT 25.7* 29.0*    Assessment/Plan: Plan for discharge tomorrow, Breastfeeding and Lactation consult No orthostatic complaints and Hgb appropriate   LOS: 2 days   [place], Januel Doolan 07/31/2011, 7:36 AM

## 2011-07-31 NOTE — Progress Notes (Signed)
I examined pt and agree with documentation above and resident plan of care. [facility]

## 2011-07-31 NOTE — Progress Notes (Signed)
Called Dr. Barbaraann Faster to assess vaginal lacerations and continued blood loss. After physical exam Dr. Barbaraann Faster stated patient ok to go to postpartum. Will order CBC for am.

## 2011-08-01 MED ORDER — OXYCODONE-ACETAMINOPHEN 5-325 MG PO TABS
1.0000 | ORAL_TABLET | Freq: Four times a day (QID) | ORAL | Status: DC | PRN
Start: 2011-08-01 — End: 2011-08-09

## 2011-08-01 MED ORDER — IBUPROFEN 600 MG PO TABS: 600.0000 mg | ORAL_TABLET | Freq: Four times a day (QID) | ORAL | Status: DC

## 2011-08-01 MED ORDER — MEASLES, MUMPS & RUBELLA VAC ~~LOC~~ INJ
0.5000 mL | INJECTION | Freq: Once | SUBCUTANEOUS | Status: AC
  Administered 2011-08-01: 0.5 mL via SUBCUTANEOUS
  Filled 2011-08-01 (×2): qty 0.5

## 2011-08-01 NOTE — Progress Notes (Signed)
UR chart review completed.  

## 2011-08-01 NOTE — Discharge Summary (Signed)
Obstetric Discharge Summary Reason for Admission: induction of labor due to HTN Prenatal Procedures: ultrasound Intrapartum Procedures: spontaneous vaginal delivery Postpartum Procedures: none Complications-Operative and Postpartum: 2 degree perineal laceration Hemoglobin  Date Value Range Status  07/31/2011 8.4* 12.0-15.0 (g/dL) Final     HCT  Date Value Range Status  07/31/2011 25.7* 36.0-46.0 (%) Final    Physical Exam:  General: alert and cooperative Lochia: appropriate Uterine Fundus: firm Incision: n/a DVT Evaluation: No evidence of DVT seen on physical exam.  Discharge Diagnoses: Term Pregnancy-delivered  Discharge Information: Date: 08/01/2011 Activity: pelvic rest Diet: regular Medications: PNV, Ibuprofen and Percocet Condition: stable Instructions: refer to practice specific booklet Discharge to: home Follow-up Information    Schedule an appointment as soon as possible for a visit in 6 weeks to follow up.         Newborn Data: Live born female  Birth Weight: 7 lb 1.2 oz (3209 g) APGAR: 9, 10  Home with mother.  Lyndee Hensen MD 08/01/2011, 7:45 AM  Patient seen and examined.  Agree with above note.  Kord Monette JEHIEL 08/01/2011 9:22 AM

## 2011-08-09 ENCOUNTER — Encounter (HOSPITAL_COMMUNITY): Payer: Self-pay | Admitting: *Deleted

## 2011-08-09 ENCOUNTER — Inpatient Hospital Stay (HOSPITAL_COMMUNITY)
Admission: AD | Admit: 2011-08-09 | Discharge: 2011-08-09 | Disposition: A | Discharge status: Home / Self Care | Payer: Medicaid Other | Source: Ambulatory Visit | Attending: Family Medicine | Admitting: Family Medicine

## 2011-08-09 DIAGNOSIS — N949 Unspecified condition associated with female genital organs and menstrual cycle: Secondary | ICD-10-CM | POA: Insufficient documentation

## 2011-08-09 DIAGNOSIS — O99893 Other specified diseases and conditions complicating puerperium: Secondary | ICD-10-CM | POA: Insufficient documentation

## 2011-08-09 LAB — URINE MICROSCOPIC-ADD ON

## 2011-08-09 LAB — URINALYSIS, ROUTINE W REFLEX MICROSCOPIC
Nitrite: NEGATIVE
Specific Gravity, Urine: <1.005 — ABNORMAL LOW (ref 1.005–1.030)
pH: 6 (ref 5.0–8.0)

## 2011-08-09 LAB — WET PREP, GENITAL
Clue Cells Wet Prep HPF POC: NONE SEEN
Trich, Wet Prep: NONE SEEN
Yeast Wet Prep HPF POC: NONE SEEN

## 2011-08-09 MED ORDER — SENNOSIDES 8.6 MG PO TABS: 1.0000 | ORAL_TABLET | Freq: Every day | ORAL | Status: DC

## 2011-08-09 MED ORDER — FLUCONAZOLE 150 MG PO TABS: 150.0000 mg | ORAL_TABLET | Freq: Once | ORAL | Status: AC

## 2011-08-09 MED ORDER — OXYCODONE-ACETAMINOPHEN 5-325 MG PO TABS: 1.0000 | ORAL_TABLET | Freq: Four times a day (QID) | ORAL | Status: AC | PRN

## 2011-08-09 MED ORDER — POLYETHYLENE GLYCOL 3350 17 G PO PACK: 17.0000 g | PACK | Freq: Two times a day (BID) | ORAL | Status: AC

## 2011-08-09 MED ORDER — IBUPROFEN 600 MG PO TABS: 600.0000 mg | ORAL_TABLET | Freq: Four times a day (QID) | ORAL | Status: AC

## 2011-08-09 NOTE — MAU Note (Signed)
LC seeing this Mom in MAU S/P Vaginal 4/27 , RN caring for mom called LC at moms request due to infant being tx for "THRUSH " .  When [facility] entered room mom feeding infant on left breast with proper latch in a consistent pattern . Mom reports comfort .  Per mom infant started tx for thrush last night "Nystatin mouth swabbing " . ( LC assessed infants mouth and noted a thick white coating over the entire tongue) Mom denies any difficulty pulling off the breast , infant has been gassy , and no diaper rash.  Moms denies nipple soreness, and no burning nipple pain , billaterally pink nipples , small cracking , no white spots , mom denies engorgement  Mom also denies any other yeast infection . Mom has been using lanolin ( LC recommended to stop using the lanolin )  Mom mentioned that the Smart start RN noted the thrush and slow weight gain . F/U for weight check this Friday MAY 10th at 0900 . Mom has been breast feeding and was considering introducing a bottle  During visit reviewed the "FACT SHEET (THRUSH) , and the Patient tx for care of thrush for mom and baby .  Outpatient appointment with lactation services set up for Friday May 10th a@230p  to assess feeding due to low weight gain and thrush .  LC recommended to mom to continue to breastfeeding every 2-3 hours and on demand and tx for thrush for Mom and baby .  MAU T/S in MAU wrote a prescription for mom for Diffucan . LC recommended following the tx for nipples with Lotrimin AF ( or suggested creams ) on tx sheet .  Mom and dad receptive to recommendations , copies of tx sheets given to mom .  LC will call mom to confirm appointment on 5/10 at 230p .

## 2011-08-09 NOTE — MAU Note (Signed)
Lactation consultant contacted, will come to see pt if time allows.  Pt's infant has recently been dx'd with thrush & pt has questions.

## 2011-08-09 NOTE — MAU Note (Signed)
Pt had vag delivery on 4/27, had a BM this a.m. But had been 2 days since last BM.  Pt states she still feel somewhat constipated.  However, pt states she came in because of vaginal & lower abd pain.  This has been a problem since discharge.

## 2011-08-09 NOTE — MAU Note (Signed)
Vag delivery 10days ago.  Had a 3rd degree.  Ran out of pain meds yesterday.

## 2011-08-09 NOTE — MAU Note (Signed)
Baby is breast feeding, doing well. States hurts to have a bowel movement. Has been constipated, has been taking stool softeners. Denies any urinary problems.

## 2011-08-09 NOTE — Discharge Instructions (Signed)
Please take the percocet and ibuprofen as prescribed for pain. Please follow up with your OBs office for future complaints. If you develop fever, severe unrelenting pain, purulent discharge, or increased bleeding please come back to the MAU. Please make sure you are having a bowel movement daily. Take the miralax and Senokot as prescribed.   Please take your antifungal medication (1 pill) and use disposable breast pads until Jocelyn Garcia thrush clears up. Continue to use lanolin to assist with breast cracking or soreness.   Postpartum Care After Vaginal Delivery  After you deliver your baby, you will stay in the hospital for 24 to 72 hours, unless there were problems with the labor or delivery, or you have medical problems. While you are in the hospital, you will receive help and instructions on how to care for yourself and your baby. Your doctor will order pain medicine, in case you need it. You will have a small amount of bleeding from your vagina and should change your sanitary pad frequently. Wash your hands thoroughly with soap and water for at least 20 seconds after changing pads and using the toilet. Let the nurses know if you begin to pass blood clots or your bleeding increases. Do not flush blood clots down the toilet before having the nurse look at them, to make sure there is no placental tissue with them. If you had an intravenous (IV), it will be removed within 24 hours, if there are no problems. The first time you get out of bed or take a shower, call the nurse to help you because you may get weak, lightheaded, or even faint. If you are breastfeeding, you may feel painful contractions of your uterus for a couple of weeks. This is normal. The contractions help your uterus get back to normal size. If you are not breastfeeding, wear a supportive bra and handle your breasts as little as possible until your milk has dried up. Hormones should not be given to dry up the breasts, because they can cause  blood clots. You will be given your normal diet, unless you have diabetes or other medical problems.  The nurses may put an ice pack on your episiotomy (surgically enlarged opening), if you have one, to reduce the pain and swelling. On rare occasions, you may not be able to urinate and the nurse will need to empty your bladder with a catheter. If you had a postpartum tubal ligation ("tying tubes," female sterilization), it should not make your stay in the hospital longer. You may have your baby in your room with you as much as you like, unless you or the baby has a problem. Use the bassinet (basket) for the baby when going to and from the nursery. Do not carry the baby. Do not leave the postpartum area. If the mother is Rh negative (lacks a protein on the red blood cells) and the baby is Rh positive, the mother should get a Rho-gam shot to prevent Rh problems with future pregnancies. You may be given written instructions for you and your baby, and necessary medicines, when you are discharged from the hospital. Be sure you understand and follow the instructions as advised. HOME CARE INSTRUCTIONS   Follow instructions and take the medicines given to you.   Only take over-the-counter or prescription medicines for pain, discomfort, or fever as directed by your caregiver.   Do not take aspirin, because it can cause bleeding.   Increase your activities a little bit every day to build up your  strength and endurance.   Do not drink alcohol, especially if you are breastfeeding or taking pain medicine.   Take your temperature twice a day and record it.   You may have a small amount of bleeding or spotting for 2 to 4 weeks. This is normal.   Do not use tampons or douche. Use sanitary pads.   Try to have someone stay and help you for a few days when you go home.   Try to rest or take a nap when the baby is sleeping.   If you are breastfeeding, wear a good support bra. If you are not breastfeeding, wear  a supportive bra and do not stimulate your nipples.   Eat a healthy, nutritious diet and continue to take your prenatal vitamins.   Do not drive, do any heavy activities, or travel until your caregiver tells you it is okay.   Do not have intercourse until your caregiver gives you permission to do so.   Ask your caregiver when you can begin to exercise and what type of exercises to do.   Call your caregiver if you think you are having a problem from your delivery.   Call your pediatrician if you are having a problem with the baby.   Schedule your postpartum visit and keep it.  SEEK MEDICAL CARE IF:   You have a temperature of 100 F (37.8 C) or higher.   You have increased vaginal bleeding or are passing clots. Save any clots to show your caregiver.   You have bloody urine or pain when you urinate.   You have a bad smelling vaginal discharge.   You have increasing pain or swelling on your episiotomy.   You develop a severe headache.   You feel depressed.   The episiotomy is separating.   You become dizzy or lightheaded.   You develop a rash.   You have a reaction or problems with your medicine.   You have pain, redness, or swelling at the intravenous site.  SEEK IMMEDIATE MEDICAL CARE IF:   You have chest pain.   You develop shortness of breath.   You pass out.   You develop pain, with or without swelling or redness in your leg.   You develop heavy vaginal bleeding, with or without blood clots.   You develop stomach pain.   You develop a bad smelling vaginal discharge.  MAKE SURE YOU:   Understand these instructions.   Will watch your condition.   Will get help right away if you are not doing well or get worse.  Document Released: 01/16/2007 Document Revised: 03/10/2011 Document Reviewed: 01/28/2009 [facility] Patient Information 2012 [place].

## 2011-08-09 NOTE — MAU Provider Note (Signed)
History     CSN: [id number]  Arrival date and time: 08/09/11 1003   First Provider Initiated Contact with Patient 08/09/11 1037      Chief Complaint  Patient presents with  . perineum pain    HPI  CC: Vaginal pain  25yo G1P1 10 days PP NSVD w/ second degree perineal laceration. Pt Dc home w/ Percocet 30 and Ibuprofen 600 30. Pt has taken Percocet Q6 since discharge. Continues to have pain that is described as ache and sharp at times. Pain is made worse w/ certain movements and w/ bowel movements which have been described as hard and QOD. Pt has tried OTC stool softeners w/o relief of the constipation. Denies any vaginal discharge, fever, rash, and abdominal pain. Lochia is diminished significantly.    Past Medical History  Diagnosis Date  . Anemia   . Asthma     exercise induced  . Urinary tract infection   . Chronic kidney disease   . Kidney stones   . Pregnancy induced hypertension     Past Surgical History  Procedure Date  . Wisdom tooth extraction     Family History  Problem Relation Age of Onset  . Anesthesia problems Neg Hx     History  Substance Use Topics  . Smoking status: Former Research scientist (life sciences)  . Smokeless tobacco: Never Used  . Alcohol Use: No    Allergies: No Known Allergies  Prescriptions prior to admission  Medication Sig Dispense Refill  . albuterol (PROVENTIL HFA;VENTOLIN HFA) 108 (90 BASE) MCG/ACT inhaler Inhale 2 puffs into the lungs every 6 (six) hours as needed. For shortness of breath      . calcium carbonate (TUMS EX) 750 MG chewable tablet Chew 1 tablet by mouth at bedtime as needed. For heartburn      . ferrous sulfate 325 (65 FE) MG tablet Take 325 mg by mouth 3 (three) times daily with meals.      Marland Kitchen ibuprofen (ADVIL,MOTRIN) 600 MG tablet Take 1 tablet (600 mg total) by mouth every 6 (six) hours.  30 tablet  0  . oxyCODONE-acetaminophen (PERCOCET) 5-325 MG per tablet Take 1-2 tablets by mouth every 6 (six) hours as needed (moderate - severe  pain).  30 tablet  0  . Prenatal Vit-Fe Fumarate-FA (PRENATAL MULTIVITAMIN) TABS Take 1 tablet by mouth at bedtime.        Review of Systems  Respiratory: Negative for shortness of breath.   Cardiovascular: Negative for chest pain.  Gastrointestinal: Negative for abdominal pain and diarrhea.  Genitourinary: Negative for dysuria, urgency, frequency and hematuria.  Neurological: Negative for headaches.   Physical Exam   Blood pressure 137/78, pulse 83, temperature 97.6 F (36.4 C), temperature source Oral, resp. rate 20, height 5' 5"  (1.651 m), weight 96.163 kg (212 lb), currently breastfeeding.  Physical Exam  Constitutional: She appears well-developed and well-nourished.  Cardiovascular: Normal rate and regular rhythm.   Respiratory: Effort normal.  GI: Soft. She exhibits no distension. There is no tenderness. There is no rebound and no guarding.  Genitourinary:       Vaginal wall soft and pink, no signs of erythema or discharge. Areas of lacerations w/ healthy appearing tissue.   Musculoskeletal: Normal range of motion.  Skin: Skin is warm and dry. No rash noted. No erythema.    MAU Course  Procedures  Wet prep - moderate WBC UA - leuks and hgb and squamous epi  Assessment and Plan  25yo post partum pt w/ vaginal pain secondary to  2nd degree laceration repair which is healing well and complicated by constipation.  - Miralax, Senokot - Percocet - Ibuprofen - DC iron -  - Pt instructed to f/u w/ OB in clinic - Pt to start using disposable breast pads and lanolin. Diflucan x1 as pts baby being treated for thrush   Lajuan Godbee, MD 08/09/2011, 11:06 AM

## 2011-08-14 NOTE — MAU Provider Note (Signed)
I have seen/examined this patient and agree with the resident's assessment and plan. Jocelyn Garcia.

## 2011-08-22 ENCOUNTER — Inpatient Hospital Stay (HOSPITAL_COMMUNITY)
Admission: AD | Admit: 2011-08-22 | Discharge: 2011-08-22 | Disposition: A | Discharge status: Hospice | Payer: Medicaid Other | Source: Ambulatory Visit | Attending: Obstetrics & Gynecology | Admitting: Obstetrics & Gynecology

## 2011-08-22 ENCOUNTER — Encounter (HOSPITAL_COMMUNITY): Payer: Self-pay | Admitting: *Deleted

## 2011-08-22 DIAGNOSIS — O9089 Other complications of the puerperium, not elsewhere classified: Secondary | ICD-10-CM

## 2011-08-22 DIAGNOSIS — N949 Unspecified condition associated with female genital organs and menstrual cycle: Secondary | ICD-10-CM | POA: Insufficient documentation

## 2011-08-22 DIAGNOSIS — O26899 Other specified pregnancy related conditions, unspecified trimester: Secondary | ICD-10-CM

## 2011-08-22 DIAGNOSIS — O99893 Other specified diseases and conditions complicating puerperium: Secondary | ICD-10-CM | POA: Insufficient documentation

## 2011-08-22 MED ORDER — OXYCODONE-ACETAMINOPHEN 5-325 MG PO TABS: 1.0000 | ORAL_TABLET | ORAL | Status: AC | PRN

## 2011-08-22 NOTE — Discharge Instructions (Signed)
Sitz Bath A sitz bath is a warm water bath taken in the sitting position that covers only the hips and buttocks. It may be used for either healing or hygiene purposes. Sitz baths are also used to relieve pain, itching, or muscle spasms. The water may contain medicine. Moist heat will help you heal and relax.  HOME CARE INSTRUCTIONS   Fill the bathtub half full with warm water.   Sit in the water and open the drain a little.   Turn on the warm water to keep the tub half full. Keep the water running constantly.   Soak in the water for 15 to 20 minutes.   After the sitz bath, pat the affected area dry first.   Take 3 to 4 sitz baths a day.  SEEK MEDICAL CARE IF:  You get worse instead of better. Stop the sitz baths if you get worse. MAKE SURE YOU:  Understand these instructions.   Will watch your condition.   Will get help right away if you are not doing well or get worse.  Document Released: 12/12/2003 Document Revised: 03/10/2011 Document Reviewed: 06/18/2010 [facility] Patient Information 2012 [place].

## 2011-08-22 NOTE — MAU Note (Signed)
Pt delivered via SVD on the 27th of April.  She states her stitches are increasingly more sore over the past week and she takes 638m ibuprophen with minimal relief. Her mom looked at it and said it didn't look like her stitches were healing well.  She denies any foul smell or discharge.

## 2011-08-22 NOTE — MAU Note (Signed)
Patient state she had a SVD on 4-27 and had a 3rd degree laceration. States she has been having increasing pain at the posterior area of the vagina and has noticed bright red blood.

## 2011-08-22 NOTE — Progress Notes (Signed)
Perineum without swelling or redness or discharge.

## 2012-03-09 ENCOUNTER — Encounter (HOSPITAL_COMMUNITY): Payer: Self-pay | Admitting: Family Medicine

## 2012-03-09 ENCOUNTER — Emergency Department (HOSPITAL_COMMUNITY)
Admission: EM | Admit: 2012-03-09 | Discharge: 2012-03-09 | Disposition: A | Discharge status: Home / Self Care | Payer: Self-pay | Attending: Emergency Medicine | Admitting: Emergency Medicine

## 2012-03-09 DIAGNOSIS — K089 Disorder of teeth and supporting structures, unspecified: Secondary | ICD-10-CM | POA: Insufficient documentation

## 2012-03-09 DIAGNOSIS — R112 Nausea with vomiting, unspecified: Secondary | ICD-10-CM | POA: Insufficient documentation

## 2012-03-09 DIAGNOSIS — Z87440 Personal history of urinary (tract) infections: Secondary | ICD-10-CM | POA: Insufficient documentation

## 2012-03-09 DIAGNOSIS — Z87442 Personal history of urinary calculi: Secondary | ICD-10-CM | POA: Insufficient documentation

## 2012-03-09 DIAGNOSIS — J45990 Exercise induced bronchospasm: Secondary | ICD-10-CM | POA: Insufficient documentation

## 2012-03-09 DIAGNOSIS — K0889 Other specified disorders of teeth and supporting structures: Secondary | ICD-10-CM

## 2012-03-09 DIAGNOSIS — F17200 Nicotine dependence, unspecified, uncomplicated: Secondary | ICD-10-CM | POA: Insufficient documentation

## 2012-03-09 DIAGNOSIS — N189 Chronic kidney disease, unspecified: Secondary | ICD-10-CM | POA: Insufficient documentation

## 2012-03-09 DIAGNOSIS — Z862 Personal history of diseases of the blood and blood-forming organs and certain disorders involving the immune mechanism: Secondary | ICD-10-CM | POA: Insufficient documentation

## 2012-03-09 MED ORDER — OXYCODONE-ACETAMINOPHEN 5-325 MG PO TABS: 2.0000 | ORAL_TABLET | ORAL | Status: DC | PRN

## 2012-03-09 MED ORDER — OXYCODONE-ACETAMINOPHEN 5-325 MG PO TABS
2.0000 | ORAL_TABLET | Freq: Once | ORAL | Status: AC
  Administered 2012-03-09: 2 via ORAL
  Filled 2012-03-09: qty 2

## 2012-03-09 MED ORDER — PENICILLIN V POTASSIUM 500 MG PO TABS: 500.0000 mg | ORAL_TABLET | Freq: Four times a day (QID) | ORAL | Status: AC

## 2012-03-09 NOTE — ED Notes (Signed)
Per pt sts right lower dental pain for a few weeks. sts has been taking her brother abx and was getting better but ran out.

## 2012-03-09 NOTE — ED Provider Notes (Signed)
History   This chart was scribed for Ezequiel Essex, MD by Shona Needles, ED Scribe. The patient was seen in room TR06C/TR06C. Patient's care was started at 1217.   CSN: [id number]  Arrival date & time 03/09/12  1217   First MD Initiated Contact with Patient 03/09/12 1226      Chief Complaint  Patient presents with  . Dental Pain    Patient is a 26 y.o. female presenting with tooth pain.  Dental Pain Additional symptoms do not include: trouble swallowing.    Jocelyn Garcia is a 26 y.o. female who presents to the Emergency Department complaining of 2 weeks of progressive, gradually worsening, constant R mandibular dental pain. Pain is described as a stabbing soreness. No alleviators. Aggravated with eating and drinking. Pt reports mild improvement with antibiotic use until she ran out. No difficulty swallowing, neck pain, fever, chills, cough, congestion, rhinorrhea, chest pain, SOB, or n/v/d. Pt is a current everyday smoker, admits marijuana, and denies alcohol. Medical Hx includes Asthma.    Past Medical History  Diagnosis Date  . Anemia   . Asthma     exercise induced  . Urinary tract infection   . Chronic kidney disease   . Kidney stones     Past Surgical History  Procedure Date  . Wisdom tooth extraction     Family History  Problem Relation Age of Onset  . Anesthesia problems Neg Hx   . Heart disease Mother   . Hypertension Mother   . Diabetes Mother   . Heart disease Maternal Grandmother   . Stroke Maternal Grandmother   . Hypertension Maternal Grandmother   . Cancer Maternal Grandfather     History  Substance Use Topics  . Smoking status: Current Some Day Smoker  . Smokeless tobacco: Never Used  . Alcohol Use: No    OB History    Grav Para Term Preterm Abortions TAB SAB Ect Mult Living   1 1 1       1       Review of Systems  HENT: Positive for dental problem. Negative for trouble swallowing.   Gastrointestinal: Positive for nausea and vomiting.     Allergies  Review of patient's allergies indicates no known allergies.  Home Medications   Current Outpatient Rx  Name  Route  Sig  Dispense  Refill  . ALBUTEROL SULFATE HFA 108 (90 BASE) MCG/ACT IN AERS   Inhalation   Inhale 2 puffs into the lungs every 6 (six) hours as needed. For shortness of breath         . PRENATAL MULTIVITAMIN CH   Oral   Take 1 tablet by mouth at bedtime.           BP 149/87  Pulse 64  Temp 98 F (36.7 C)  Resp 18  SpO2 99%  LMP 03/08/2012  Breastfeeding? No  Physical Exam  Nursing note and vitals reviewed. Constitutional: She appears well-developed and well-nourished.  HENT:  Head: Normocephalic and atraumatic.       Right lowersecond molar is carious no abscess. Floor of mouth is soft.  Eyes: Conjunctivae normal are normal. Pupils are equal, round, and reactive to light.  Neck: Neck supple. No tracheal deviation present. No thyromegaly present.  Cardiovascular: Normal rate and regular rhythm.   No murmur heard. Pulmonary/Chest: Effort normal and breath sounds normal.  Abdominal: Soft. Bowel sounds are normal. She exhibits no distension. There is no tenderness.  Musculoskeletal: Normal range of motion. She exhibits no edema  and no tenderness.  Neurological: She is alert. Coordination normal.  Skin: Skin is warm and dry. No rash noted.  Psychiatric: She has a normal mood and affect.    ED Course  Procedures DIAGNOSTIC STUDIES: Oxygen Saturation is 99% on room air, normal by my interpretation.    COORDINATION OF CARE: 12:31- Evaluated Pt. Pt is awake, alert, and without distress.    Labs Reviewed - No data to display No results found.   No diagnosis found.    MDM  Right-sided lower dental pain for a few weeks. No fevers or chills. Difficulty breathing or swallowing.  Carious teeth without evidence of abscess. No evidence of Ludwig angina.  Pain control, antibiotics and dental referral      I personally  performed the services described in this documentation, which was scribed in my presence. The recorded information has been reviewed and is accurate.    Ezequiel Essex, MD 03/09/12 1251

## 2012-04-04 NOTE — L&D Delivery Note (Signed)
Delivery Note At 8:32 PM a viable female was delivered via Vaginal, Spontaneous Delivery (Presentation: vertex.  APGAR 8 and 9; weight pending.   Pt. Delivered unattended in the bed.  She had been checked 10 minutes before by RN and was 8 cm and high.  The patient stated she could not find her call bell.  On my arrival, fetus was delivered on mom's abdomen.  Cord blood obtained and Umbilical cord was clamped x 2 and cut.   Placenta status: Intact, Expressed.  Cord: 3 vessels with the following complications: None.    Anesthesia: None  Episiotomy: None Lacerations: 1st degree perineal and peri-urethral, not bleeding Est. Blood Loss (mL): 300 cc  Mom to postpartum.  Baby to stay with mom skin to skin.  Shakora Nordquist S 12/08/2012, 9:00 PM

## 2012-07-19 ENCOUNTER — Ambulatory Visit: Payer: Medicaid Other

## 2012-07-24 ENCOUNTER — Other Ambulatory Visit: Payer: Self-pay | Admitting: Obstetrics & Gynecology

## 2012-07-24 ENCOUNTER — Encounter: Payer: Self-pay | Admitting: Obstetrics & Gynecology

## 2012-07-24 ENCOUNTER — Ambulatory Visit (INDEPENDENT_AMBULATORY_CARE_PROVIDER_SITE_OTHER): Payer: Self-pay

## 2012-07-24 DIAGNOSIS — Z3490 Encounter for supervision of normal pregnancy, unspecified, unspecified trimester: Secondary | ICD-10-CM

## 2012-07-24 LAB — POCT PREGNANCY, URINE: Preg Test, Ur: POSITIVE — AB

## 2012-07-25 LAB — HIV ANTIBODY (ROUTINE TESTING W REFLEX): HIV: NONREACTIVE

## 2012-07-25 LAB — OBSTETRIC PANEL
Antibody Screen: NEGATIVE
Basophils Absolute: 0 10*3/uL (ref 0.0–0.1)
Basophils Relative: 0 % (ref 0–1)
Eosinophils Relative: 1 % (ref 0–5)
HCT: 28.9 % — ABNORMAL LOW (ref 36.0–46.0)
MCHC: 32.5 g/dL (ref 30.0–36.0)
MCV: 80.1 fL (ref 78.0–100.0)
Monocytes Absolute: 0.4 10*3/uL (ref 0.1–1.0)
Neutro Abs: 7.6 10*3/uL (ref 1.7–7.7)
Platelets: 223 10*3/uL (ref 150–400)
RDW: 16 % — ABNORMAL HIGH (ref 11.5–15.5)

## 2012-09-25 ENCOUNTER — Encounter: Payer: Self-pay | Admitting: Obstetrics & Gynecology

## 2012-09-25 ENCOUNTER — Other Ambulatory Visit (HOSPITAL_COMMUNITY)
Admission: RE | Admit: 2012-09-25 | Discharge: 2012-09-25 | Disposition: A | Discharge status: Home / Self Care | Payer: Medicaid Other | Source: Ambulatory Visit | Attending: Obstetrics & Gynecology | Admitting: Obstetrics & Gynecology

## 2012-09-25 ENCOUNTER — Ambulatory Visit (INDEPENDENT_AMBULATORY_CARE_PROVIDER_SITE_OTHER): Payer: Medicaid Other | Admitting: Obstetrics & Gynecology

## 2012-09-25 VITALS — BP 126/91 | Temp 97.1°F | Wt 219.0 lb

## 2012-09-25 DIAGNOSIS — Z3480 Encounter for supervision of other normal pregnancy, unspecified trimester: Secondary | ICD-10-CM | POA: Insufficient documentation

## 2012-09-25 DIAGNOSIS — Z01419 Encounter for gynecological examination (general) (routine) without abnormal findings: Secondary | ICD-10-CM | POA: Insufficient documentation

## 2012-09-25 DIAGNOSIS — O99210 Obesity complicating pregnancy, unspecified trimester: Secondary | ICD-10-CM | POA: Insufficient documentation

## 2012-09-25 DIAGNOSIS — E669 Obesity, unspecified: Secondary | ICD-10-CM | POA: Insufficient documentation

## 2012-09-25 DIAGNOSIS — O0930 Supervision of pregnancy with insufficient antenatal care, unspecified trimester: Secondary | ICD-10-CM | POA: Insufficient documentation

## 2012-09-25 DIAGNOSIS — Z113 Encounter for screening for infections with a predominantly sexual mode of transmission: Secondary | ICD-10-CM | POA: Insufficient documentation

## 2012-09-25 LAB — POCT URINALYSIS DIP (DEVICE)
Bilirubin Urine: NEGATIVE
Glucose, UA: NEGATIVE mg/dL
Ketones, ur: NEGATIVE mg/dL
Leukocytes, UA: NEGATIVE

## 2012-09-25 MED ORDER — PRENATAL 27-1 MG PO TABS: 1.0000 | ORAL_TABLET | Freq: Every morning | ORAL | Status: DC

## 2012-09-25 NOTE — Progress Notes (Signed)
Pulse 93 2nd BP: 117/76

## 2012-09-25 NOTE — Addendum Note (Signed)
Addended by: Emily Filbert on: 09/25/2012 10:57 AM   Modules accepted: Orders

## 2012-09-25 NOTE — Progress Notes (Signed)
   Subjective:    Jocelyn Garcia is a G2P1001 Unknown being seen today for her first obstetrical visit.  Her obstetrical history is significant for obesity. Patient does intend to breast feed. Pregnancy history fully reviewed.  Patient reports no bleeding, no contractions, no cramping and no leaking. She has felt FM for at least 4 weeks.  Filed Vitals:   09/25/12 0948  BP: 126/91  Temp: 97.1 F (36.2 C)  Weight: 219 lb (99.338 kg)    HISTORY: OB History   Grav Para Term Preterm Abortions TAB SAB Ect Mult Living   2 1 1       1      # Outc Date GA Lbr Len/2nd Wgt Sex Del Anes PTL Lv   1 TRM 4/13 [id number] / 00:47 7lb1.2oz(3.209kg) F SVD None  Yes   Comments: Normal newborn   2 CUR              Past Medical History  Diagnosis Date  . Anemia   . Asthma     exercise induced  . Urinary tract infection   . Chronic kidney disease   . Kidney stones    Past Surgical History  Procedure Laterality Date  . Wisdom tooth extraction     Family History  Problem Relation Age of Onset  . Anesthesia problems Neg Hx   . Heart disease Mother   . Hypertension Mother   . Diabetes Mother   . Heart disease Maternal Grandmother   . Stroke Maternal Grandmother   . Hypertension Maternal Grandmother   . Cancer Maternal Grandfather      Exam    Uterus:     Pelvic Exam:    Perineum: No Hemorrhoids   Vulva: normal   Vagina:  normal mucosa   pH:    Cervix: anteverted   Adnexa: normal adnexa   Bony Pelvis: android  System: Breast:  normal appearance, no masses or tenderness   Skin: normal coloration and turgor, no rashes    Neurologic: oriented   Extremities: normal strength, tone, and muscle mass   HEENT PERRLA   Mouth/Teeth mucous membranes moist, pharynx normal without lesions   Neck supple   Cardiovascular: regular rate and rhythm   Respiratory:  appears well, vitals normal, no respiratory distress, acyanotic, normal RR, ear and throat exam is normal, neck free of mass  or lymphadenopathy, chest clear, no wheezing, crepitations, rhonchi, normal symmetric air entry   Abdomen: soft, non-tender; bowel sounds normal; no masses,  no organomegaly   Urinary: urethral meatus normal   Uterus: measures 25 cm   Assessment:    Pregnancy: G2P1001 Patient Active Problem List   Diagnosis Date Noted  . Supervision of other normal pregnancy [id number]/[id number]    Uncertain dates Obesity in pregnancy Desires permanent sterility   Plan:     Initial labs drawn. Prenatal vitamins. Problem list reviewed and updated. Genetic Screening discussed First Screen and Quad Screen: too late.  Ultrasound discussed; fetal survey: requested.  Follow up in 2 weeks for glucola and labs and TDAP Sign MCD PPS forms as soon as she gets MCD   Darivs Lunden C. 09/25/2012

## 2012-09-25 NOTE — Addendum Note (Signed)
Addended by: Ernie Avena on: 09/25/2012 10:52 AM   Modules accepted: Orders

## 2012-09-25 NOTE — Addendum Note (Signed)
Addended by: Ernie Avena on: 09/25/2012 10:49 AM   Modules accepted: Orders

## 2012-09-26 ENCOUNTER — Encounter: Payer: Self-pay | Admitting: Obstetrics & Gynecology

## 2012-09-26 LAB — GLUCOSE TOLERANCE, 1 HOUR (50G) W/O FASTING: Glucose, 1 Hour GTT: 134 mg/dL (ref 70–140)

## 2012-09-27 ENCOUNTER — Ambulatory Visit (HOSPITAL_COMMUNITY)
Admission: RE | Admit: 2012-09-27 | Discharge: 2012-09-27 | Disposition: A | Discharge status: Home / Self Care | Payer: Medicaid Other | Source: Ambulatory Visit | Attending: Obstetrics & Gynecology | Admitting: Obstetrics & Gynecology

## 2012-09-27 DIAGNOSIS — E669 Obesity, unspecified: Secondary | ICD-10-CM | POA: Insufficient documentation

## 2012-09-27 DIAGNOSIS — O0930 Supervision of pregnancy with insufficient antenatal care, unspecified trimester: Secondary | ICD-10-CM | POA: Insufficient documentation

## 2012-09-27 DIAGNOSIS — Z3480 Encounter for supervision of other normal pregnancy, unspecified trimester: Secondary | ICD-10-CM

## 2012-09-27 DIAGNOSIS — O99210 Obesity complicating pregnancy, unspecified trimester: Secondary | ICD-10-CM

## 2012-09-27 LAB — CULTURE, OB URINE: Colony Count: 55000

## 2012-10-10 ENCOUNTER — Ambulatory Visit (INDEPENDENT_AMBULATORY_CARE_PROVIDER_SITE_OTHER): Payer: Medicaid Other | Admitting: Obstetrics and Gynecology

## 2012-10-10 ENCOUNTER — Encounter: Payer: Self-pay | Admitting: Obstetrics and Gynecology

## 2012-10-10 VITALS — BP 130/80 | Temp 97.3°F | Wt 222.0 lb

## 2012-10-10 DIAGNOSIS — Z23 Encounter for immunization: Secondary | ICD-10-CM

## 2012-10-10 DIAGNOSIS — O0933 Supervision of pregnancy with insufficient antenatal care, third trimester: Secondary | ICD-10-CM

## 2012-10-10 DIAGNOSIS — O09899 Supervision of other high risk pregnancies, unspecified trimester: Secondary | ICD-10-CM

## 2012-10-10 DIAGNOSIS — O09893 Supervision of other high risk pregnancies, third trimester: Secondary | ICD-10-CM

## 2012-10-10 DIAGNOSIS — O0930 Supervision of pregnancy with insufficient antenatal care, unspecified trimester: Secondary | ICD-10-CM

## 2012-10-10 LAB — POCT URINALYSIS DIP (DEVICE)
Ketones, ur: NEGATIVE mg/dL
Leukocytes, UA: NEGATIVE
Nitrite: NEGATIVE
Protein, ur: NEGATIVE mg/dL
pH: 7 (ref 5.0–8.0)

## 2012-10-10 MED ORDER — TETANUS-DIPHTH-ACELL PERTUSSIS 5-2.5-18.5 LF-MCG/0.5 IM SUSP
0.5000 mL | Freq: Once | INTRAMUSCULAR | Status: AC
  Administered 2012-10-10: 0.5 mL via INTRAMUSCULAR

## 2012-10-10 NOTE — Progress Notes (Signed)
Pulse- 107 Tdap vaccine consented

## 2012-10-10 NOTE — Progress Notes (Signed)
Reviewed and revised dating. Hx neg CHTN.

## 2012-10-10 NOTE — Patient Instructions (Addendum)
Round Ligament Pain The round ligament is made up of muscle and fibrous tissue. It is attached to the uterus near the fallopian tube. The round ligament is located on both sides of the uterus and helps support the position of the uterus. It usually begins in the second trimester of pregnancy when the uterus comes out of the pelvis. The pain can come and go until the baby is delivered. Round ligament pain is not a serious problem and does not cause harm to the baby. CAUSE During pregnancy the uterus grows the most from the second trimester to delivery. As it grows, it stretches and slightly twists the round ligaments. When the uterus leans from one side to the other, the round ligament on the opposite side pulls and stretches. This can cause pain. SYMPTOMS  Pain can occur on one side or both sides. The pain is usually a short, sharp, and pinching-like. Sometimes it can be a dull, lingering and aching pain. The pain is located in the lower side of the abdomen or in the groin. The pain is internal and usually starts deep in the groin and moves up to the outside of the hip area. Pain can occur with:  Sudden change in position like getting out of bed or a chair.  Rolling over in bed.  Coughing or sneezing.  Walking too much.  Any type of physical activity. DIAGNOSIS  Your caregiver will make sure there are no serious problems causing the pain. When nothing serious is found, the symptoms usually indicate that the pain is from the round ligament. TREATMENT   Sit down and relax when the pain starts.  Flex your knees up to your belly.  Lay on your side with a pillow under your belly (abdomen) and another one between your legs.  Sit in a hot bath for 15 to 20 minutes or until the pain goes away. HOME CARE INSTRUCTIONS   Only take over-the-counter or prescriptions medicines for pain, discomfort or fever as directed by your caregiver.  Sit and stand slowly.  Avoid long walks if it  causes pain.  Stop or lessen your physical activities if it causes pain. SEEK MEDICAL CARE IF:   The pain does not go away with any of your treatment.  You need stronger medication for the pain.  You develop back pain that you did not have before with the side pain. SEEK IMMEDIATE MEDICAL CARE IF:   You develop a temperature of 102 F (38.9 C) or higher.  You develop uterine contractions.  You develop vaginal bleeding.  You develop nausea, vomiting or diarrhea.  You develop chills.  You have pain when you urinate. Document Released: 12/29/2007 Document Revised: 06/13/2011 Document Reviewed: 12/29/2007 [facility] Patient Information 2014 [place]. Pregnancy - Third Trimester The third trimester of pregnancy (the last 3 months) is a period of the most rapid growth for you and your baby. The baby approaches a length of 20 inches and a weight of 6 to 10 pounds. The baby is adding on fat and getting ready for life outside your body. While inside, babies have periods of sleeping and waking, sucking thumbs, and hiccuping. You can often feel small contractions of the uterus. This is false labor. It is also called Braxton-Hicks contractions. This is like a practice for labor. The usual problems in this stage of pregnancy include more difficulty breathing, swelling of the hands and feet from water retention, and having to urinate more often because of the  uterus and baby pressing on your bladder.  PRENATAL EXAMS  Blood work may continue to be done during prenatal exams. These tests are done to check on your health and the probable health of your baby. Blood work is used to follow your blood levels (hemoglobin). Anemia (low hemoglobin) is common during pregnancy. Iron and vitamins are given to help prevent this. You may also continue to be checked for diabetes. Some of the past blood tests may be done again.  The size of the uterus is measured during each visit. This makes sure your baby is  growing properly according to your pregnancy dates.  Your blood pressure is checked every prenatal visit. This is to make sure you are not getting toxemia.  Your urine is checked every prenatal visit for infection, diabetes, and protein.  Your weight is checked at each visit. This is done to make sure gains are happening at the suggested rate and that you and your baby are growing normally.  Sometimes, an ultrasound is performed to confirm the position and the proper growth and development of the baby. This is a test done that bounces harmless sound waves off the baby so your caregiver can more accurately determine a due date.  Discuss the type of pain medicine and anesthesia you will have during your labor and delivery.  Discuss the possibility and anesthesia if a cesarean section might be necessary.  Inform your caregiver if there is any mental or physical violence at home. Sometimes, a specialized non-stress test, contraction stress test, and biophysical profile are done to make sure the baby is not having a problem. Checking the amniotic fluid surrounding the baby is called an amniocentesis. The amniotic fluid is removed by sticking a needle into the belly (abdomen). This is sometimes done near the end of pregnancy if an early delivery is required. In this case, it is done to help make sure the baby's lungs are mature enough for the baby to live outside of the womb. If the lungs are not mature and it is unsafe to deliver the baby, an injection of cortisone medicine is given to the mother 1 to 2 days before the delivery. This helps the baby's lungs mature and makes it safer to deliver the baby. CHANGES OCCURING IN THE THIRD TRIMESTER OF PREGNANCY Your body goes through many changes during pregnancy. They vary from person to person. Talk to your caregiver about changes you notice and are concerned about.  During the last trimester, you have probably had an increase in your appetite. It is normal  to have cravings for certain foods. This varies from person to person and pregnancy to pregnancy.  You may begin to get stretch marks on your hips, abdomen, and breasts. These are normal changes in the body during pregnancy. There are no exercises or medicines to take which prevent this change.  Constipation may be treated with a stool softener or adding bulk to your diet. Drinking lots of fluids, fiber in vegetables, fruits, and whole grains are helpful.  Exercising is also helpful. If you have been very active up until your pregnancy, most of these activities can be continued during your pregnancy. If you have been less active, it is helpful to start an exercise program such as walking. Consult your caregiver before starting exercise programs.  Avoid all smoking, alcohol, non-prescribed drugs, herbs and "street drugs" during your pregnancy. These chemicals affect the formation and growth of the baby. Avoid chemicals throughout the pregnancy to ensure the delivery  of a healthy infant.  Backache, varicose veins, and hemorrhoids may develop or get worse.  You will tire more easily in the third trimester, which is normal.  The baby's movements may be stronger and more often.  You may become short of breath easily.  Your belly button may stick out.  A yellow discharge may leak from your breasts called colostrum.  You may have a bloody mucus discharge. This usually occurs a few days to a week before labor begins. HOME CARE INSTRUCTIONS   Keep your caregiver's appointments. Follow your caregiver's instructions regarding medicine use, exercise, and diet.  During pregnancy, you are providing food for you and your baby. Continue to eat regular, well-balanced meals. Choose foods such as meat, fish, milk and other low fat dairy products, vegetables, fruits, and whole-grain breads and cereals. Your caregiver will tell you of the ideal weight gain.  A physical sexual relationship may be continued  throughout pregnancy if there are no other problems such as early (premature) leaking of amniotic fluid from the membranes, vaginal bleeding, or belly (abdominal) pain.  Exercise regularly if there are no restrictions. Check with your caregiver if you are unsure of the safety of your exercises. Greater weight gain will occur in the last 2 trimesters of pregnancy. Exercising helps:  Control your weight.  Get you in shape for labor and delivery.  You lose weight after you deliver.  Rest a lot with legs elevated, or as needed for leg cramps or low back pain.  Wear a good support or jogging bra for breast tenderness during pregnancy. This may help if worn during sleep. Pads or tissues may be used in the bra if you are leaking colostrum.  Do not use hot tubs, steam rooms, or saunas.  Wear your seat belt when driving. This protects you and your baby if you are in an accident.  Avoid raw meat, cat litter boxes and soil used by cats. These carry germs that can cause birth defects in the baby.  It is easier to leak urine during pregnancy. Tightening up and strengthening the pelvic muscles will help with this problem. You can practice stopping your urination while you are going to the bathroom. These are the same muscles you need to strengthen. It is also the muscles you would use if you were trying to stop from passing gas. You can practice tightening these muscles up 10 times a set and repeating this about 3 times per day. Once you know what muscles to tighten up, do not perform these exercises during urination. It is more likely to cause an infection by backing up the urine.  Ask for help if you have financial, counseling, or nutritional needs during pregnancy. Your caregiver will be able to offer counseling for these needs as well as refer you for other special needs.  Make a list of emergency phone numbers and have them available.  Plan on getting help from family or friends when you go home  from the hospital.  Make a trial run to the hospital.  Take prenatal classes with the father to understand, practice, and ask questions about the labor and delivery.  Prepare the baby's room or nursery.  Do not travel out of the city unless it is absolutely necessary and with the advice of your caregiver.  Wear only low or no heal shoes to have better balance and prevent falling. MEDICINES AND DRUG USE IN PREGNANCY  Take prenatal vitamins as directed. The vitamin should contain 1 milligram  of folic acid. Keep all vitamins out of reach of children. Only a couple vitamins or tablets containing iron may be fatal to a baby or young child when ingested.  Avoid use of all medicines, including herbs, over-the-counter medicines, not prescribed or suggested by your caregiver. Only take over-the-counter or prescription medicines for pain, discomfort, or fever as directed by your caregiver. Do not use aspirin, ibuprofen or naproxen unless approved by your caregiver.  Let your caregiver also know about herbs you may be using.  Alcohol is related to a number of birth defects. This includes fetal alcohol syndrome. All alcohol, in any form, should be avoided completely. Smoking will cause low birth rate and premature babies.  Illegal drugs are very harmful to the baby. They are absolutely forbidden. A baby born to an addicted mother will be addicted at birth. The baby will go through the same withdrawal an adult does. SEEK MEDICAL CARE IF: You have any concerns or worries during your pregnancy. It is better to call with your questions if you feel they cannot wait, rather than worry about them. SEEK IMMEDIATE MEDICAL CARE IF:   An unexplained oral temperature above 102 F (38.9 C) develops, or as your caregiver suggests.  You have leaking of fluid from the vagina. If leaking membranes are suspected, take your temperature and tell your caregiver of this when you call.  There is vaginal spotting,  bleeding or passing clots. Tell your caregiver of the amount and how many pads are used.  You develop a bad smelling vaginal discharge with a change in the color from clear to white.  You develop vomiting that lasts more than 24 hours.  You develop chills or fever.  You develop shortness of breath.  You develop burning on urination.  You loose more than 2 pounds of weight or gain more than 2 pounds of weight or as suggested by your caregiver.  You notice sudden swelling of your face, hands, and feet or legs.  You develop belly (abdominal) pain. Round ligament discomfort is a common non-cancerous (benign) cause of abdominal pain in pregnancy. Your caregiver still must evaluate you.  You develop a severe headache that does not go away.  You develop visual problems, blurred or double vision.  If you have not felt your baby move for more than 1 hour. If you think the baby is not moving as much as usual, eat something with sugar in it and lie down on your left side for an hour. The baby should move at least 4 to 5 times per hour. Call right away if your baby moves less than that.  You fall, are in a car accident, or any kind of trauma.  There is mental or physical violence at home. Document Released: 03/15/2001 Document Revised: 12/14/2011 Document Reviewed: 09/17/2008 [facility] Patient Information 2014 [place].

## 2012-10-10 NOTE — Progress Notes (Signed)
BP rechecked manually

## 2012-10-10 NOTE — Progress Notes (Signed)
Complains of chronic back pain with some worsening during pregnancy. Advised to use heat, Tylenol up to 3077m daily, Benadryl to help sleep, central support girdle, and will consider Flexeril if pain continues to worsen.  No other concerns or questions. No bleeding/discharge.  Hx of anemia prior to pregnancy, will follow CBC at next visit. Recommended to start taking Iron supplement daily separate from prenatal vitamins for better absorption. BP elevated initially, re-checked in normal range. Will continue to monitor.

## 2012-10-11 ENCOUNTER — Encounter: Payer: Self-pay | Admitting: *Deleted

## 2012-10-24 ENCOUNTER — Ambulatory Visit (INDEPENDENT_AMBULATORY_CARE_PROVIDER_SITE_OTHER): Payer: Medicaid Other | Admitting: Family

## 2012-10-24 VITALS — BP 129/83 | Temp 97.8°F | Wt 222.8 lb

## 2012-10-24 DIAGNOSIS — Z3483 Encounter for supervision of other normal pregnancy, third trimester: Secondary | ICD-10-CM

## 2012-10-24 DIAGNOSIS — O0930 Supervision of pregnancy with insufficient antenatal care, unspecified trimester: Secondary | ICD-10-CM

## 2012-10-24 DIAGNOSIS — O09899 Supervision of other high risk pregnancies, unspecified trimester: Secondary | ICD-10-CM

## 2012-10-24 LAB — POCT URINALYSIS DIP (DEVICE)
Protein, ur: NEGATIVE mg/dL
Specific Gravity, Urine: 1.02 (ref 1.005–1.030)
Urobilinogen, UA: 0.2 mg/dL (ref 0.0–1.0)

## 2012-10-24 MED ORDER — ALBUTEROL SULFATE HFA 108 (90 BASE) MCG/ACT IN AERS: 2.0000 | INHALATION_SPRAY | Freq: Four times a day (QID) | RESPIRATORY_TRACT | Status: DC | PRN

## 2012-10-24 NOTE — Progress Notes (Signed)
Pulse-  95

## 2012-10-24 NOTE — Progress Notes (Signed)
No questions or concerns; denies UTI symptoms, urine culture sent.

## 2012-10-25 LAB — CULTURE, OB URINE: Colony Count: 60000

## 2012-11-07 ENCOUNTER — Ambulatory Visit (INDEPENDENT_AMBULATORY_CARE_PROVIDER_SITE_OTHER): Payer: Medicaid Other | Admitting: Advanced Practice Midwife

## 2012-11-07 ENCOUNTER — Encounter: Payer: Self-pay | Admitting: Advanced Practice Midwife

## 2012-11-07 VITALS — BP 131/90 | Temp 97.6°F | Wt 223.4 lb

## 2012-11-07 DIAGNOSIS — E669 Obesity, unspecified: Secondary | ICD-10-CM

## 2012-11-07 DIAGNOSIS — O0930 Supervision of pregnancy with insufficient antenatal care, unspecified trimester: Secondary | ICD-10-CM

## 2012-11-07 DIAGNOSIS — Z3490 Encounter for supervision of normal pregnancy, unspecified, unspecified trimester: Secondary | ICD-10-CM

## 2012-11-07 LAB — POCT URINALYSIS DIP (DEVICE)
Glucose, UA: NEGATIVE mg/dL
Hgb urine dipstick: NEGATIVE
Specific Gravity, Urine: 1.02 (ref 1.005–1.030)
Urobilinogen, UA: 0.2 mg/dL (ref 0.0–1.0)

## 2012-11-07 NOTE — Patient Instructions (Signed)

## 2012-11-07 NOTE — Progress Notes (Signed)
Cultures and GBS done

## 2012-11-07 NOTE — Progress Notes (Signed)
Pulse- 82

## 2012-11-08 LAB — GC/CHLAMYDIA PROBE AMP
CT Probe RNA: NEGATIVE
GC Probe RNA: NEGATIVE

## 2012-11-14 ENCOUNTER — Ambulatory Visit (INDEPENDENT_AMBULATORY_CARE_PROVIDER_SITE_OTHER): Payer: Medicaid Other | Admitting: Advanced Practice Midwife

## 2012-11-14 VITALS — BP 125/82 | Temp 98.5°F | Wt 225.0 lb

## 2012-11-14 DIAGNOSIS — O0930 Supervision of pregnancy with insufficient antenatal care, unspecified trimester: Secondary | ICD-10-CM

## 2012-11-14 LAB — POCT URINALYSIS DIP (DEVICE)
Glucose, UA: NEGATIVE mg/dL
Nitrite: NEGATIVE
Protein, ur: NEGATIVE mg/dL
Specific Gravity, Urine: 1.01 (ref 1.005–1.030)
Urobilinogen, UA: 0.2 mg/dL (ref 0.0–1.0)

## 2012-11-14 NOTE — Progress Notes (Signed)
Pulse 87

## 2012-11-14 NOTE — Progress Notes (Signed)
Doing well.  Good fetal movement, denies vaginal bleeding, LOF, regular contractions.

## 2012-11-21 ENCOUNTER — Ambulatory Visit (INDEPENDENT_AMBULATORY_CARE_PROVIDER_SITE_OTHER): Payer: Medicaid Other | Admitting: Family

## 2012-11-21 VITALS — BP 138/91 | Temp 97.0°F | Wt 225.7 lb

## 2012-11-21 DIAGNOSIS — Z3483 Encounter for supervision of other normal pregnancy, third trimester: Secondary | ICD-10-CM

## 2012-11-21 DIAGNOSIS — O0930 Supervision of pregnancy with insufficient antenatal care, unspecified trimester: Secondary | ICD-10-CM

## 2012-11-21 LAB — POCT URINALYSIS DIP (DEVICE)
Glucose, UA: NEGATIVE mg/dL
Specific Gravity, Urine: <=1.005 (ref 1.005–1.030)
Urobilinogen, UA: 0.2 mg/dL (ref 0.0–1.0)
pH: 5.5 (ref 5.0–8.0)

## 2012-11-21 LAB — CBC
MCH: 24.1 pg — ABNORMAL LOW (ref 26.0–34.0)
MCHC: 31.5 g/dL (ref 30.0–36.0)
MCV: 76.7 fL — ABNORMAL LOW (ref 78.0–100.0)
Platelets: 222 10*3/uL (ref 150–400)
RDW: 15.5 % (ref 11.5–15.5)

## 2012-11-21 LAB — COMPREHENSIVE METABOLIC PANEL
ALT: 9 U/L (ref 0–35)
AST: 11 U/L (ref 0–37)
Alkaline Phosphatase: 136 U/L — ABNORMAL HIGH (ref 39–117)
Creat: 0.54 mg/dL (ref 0.50–1.10)
Sodium: 136 mEq/L (ref 135–145)
Total Bilirubin: 0.6 mg/dL (ref 0.3–1.2)
Total Protein: 6.3 g/dL (ref 6.0–8.3)

## 2012-11-21 NOTE — Progress Notes (Signed)
Pulse-83  Pain-side pain,

## 2012-11-21 NOTE — Progress Notes (Signed)
No questions or concerns; No PreX symptoms, good fetal movement. Reviewed blood pressures with Dr. Roselie Awkward and UA > since has not reach HTN threshold >140/90, obtain CMP, CBC as baseline and have return on Friday for appt/BP check.

## 2012-11-23 ENCOUNTER — Ambulatory Visit (INDEPENDENT_AMBULATORY_CARE_PROVIDER_SITE_OTHER): Payer: Medicaid Other | Admitting: Obstetrics and Gynecology

## 2012-11-23 ENCOUNTER — Encounter: Payer: Self-pay | Admitting: Family

## 2012-11-23 ENCOUNTER — Encounter: Payer: Self-pay | Admitting: Obstetrics and Gynecology

## 2012-11-23 VITALS — BP 130/84 | Temp 97.0°F | Wt 228.3 lb

## 2012-11-23 DIAGNOSIS — O09899 Supervision of other high risk pregnancies, unspecified trimester: Secondary | ICD-10-CM

## 2012-11-23 DIAGNOSIS — O99213 Obesity complicating pregnancy, third trimester: Secondary | ICD-10-CM

## 2012-11-23 DIAGNOSIS — E669 Obesity, unspecified: Secondary | ICD-10-CM

## 2012-11-23 DIAGNOSIS — O0930 Supervision of pregnancy with insufficient antenatal care, unspecified trimester: Secondary | ICD-10-CM

## 2012-11-23 DIAGNOSIS — O99019 Anemia complicating pregnancy, unspecified trimester: Secondary | ICD-10-CM | POA: Insufficient documentation

## 2012-11-23 DIAGNOSIS — Z3483 Encounter for supervision of other normal pregnancy, third trimester: Secondary | ICD-10-CM

## 2012-11-23 DIAGNOSIS — O0933 Supervision of pregnancy with insufficient antenatal care, third trimester: Secondary | ICD-10-CM

## 2012-11-23 DIAGNOSIS — O09893 Supervision of other high risk pregnancies, third trimester: Secondary | ICD-10-CM

## 2012-11-23 LAB — POCT URINALYSIS DIP (DEVICE)
Bilirubin Urine: NEGATIVE
Hgb urine dipstick: NEGATIVE
Nitrite: NEGATIVE
Protein, ur: NEGATIVE mg/dL
pH: 7 (ref 5.0–8.0)

## 2012-11-23 NOTE — Progress Notes (Signed)
CBC and CMP normal. BP normal today. Patient asymptomatic today. Reviewed sign/symptoms of preeclampsia. FM/labore precautions also reviewed

## 2012-11-23 NOTE — Progress Notes (Signed)
Pulse-  80 Edema-feet  Pain/pressure-hip/back

## 2012-11-28 ENCOUNTER — Encounter: Payer: Medicaid Other | Admitting: Advanced Practice Midwife

## 2012-11-28 ENCOUNTER — Ambulatory Visit (INDEPENDENT_AMBULATORY_CARE_PROVIDER_SITE_OTHER): Payer: Medicaid Other | Admitting: Advanced Practice Midwife

## 2012-11-28 VITALS — BP 139/85 | Temp 97.4°F | Wt 229.0 lb

## 2012-11-28 DIAGNOSIS — O0930 Supervision of pregnancy with insufficient antenatal care, unspecified trimester: Secondary | ICD-10-CM

## 2012-11-28 DIAGNOSIS — O99019 Anemia complicating pregnancy, unspecified trimester: Secondary | ICD-10-CM

## 2012-11-28 DIAGNOSIS — O99013 Anemia complicating pregnancy, third trimester: Secondary | ICD-10-CM

## 2012-11-28 LAB — POCT URINALYSIS DIP (DEVICE)
Ketones, ur: NEGATIVE mg/dL
Protein, ur: NEGATIVE mg/dL
Specific Gravity, Urine: 1.02 (ref 1.005–1.030)
Urobilinogen, UA: 0.2 mg/dL (ref 0.0–1.0)
pH: 7 (ref 5.0–8.0)

## 2012-11-28 NOTE — Progress Notes (Signed)
Doing well.  Good fetal movement, denies vaginal bleeding, LOF, regular contractions. Plans natural labor, no epidural last labor.  Reviewed signs of labor.

## 2012-11-28 NOTE — Progress Notes (Signed)
Pulse 81 Edema trace in ankles.

## 2012-12-05 ENCOUNTER — Ambulatory Visit (INDEPENDENT_AMBULATORY_CARE_PROVIDER_SITE_OTHER): Payer: Medicaid Other | Admitting: Family Medicine

## 2012-12-05 ENCOUNTER — Telehealth (HOSPITAL_COMMUNITY): Payer: Self-pay | Admitting: *Deleted

## 2012-12-05 ENCOUNTER — Encounter: Payer: Self-pay | Admitting: Family Medicine

## 2012-12-05 ENCOUNTER — Encounter (HOSPITAL_COMMUNITY): Payer: Self-pay | Admitting: *Deleted

## 2012-12-05 VITALS — BP 128/89 | Temp 97.9°F | Wt 228.4 lb

## 2012-12-05 DIAGNOSIS — O0933 Supervision of pregnancy with insufficient antenatal care, third trimester: Secondary | ICD-10-CM

## 2012-12-05 DIAGNOSIS — O0930 Supervision of pregnancy with insufficient antenatal care, unspecified trimester: Secondary | ICD-10-CM

## 2012-12-05 LAB — POCT URINALYSIS DIP (DEVICE)
Hgb urine dipstick: NEGATIVE
Protein, ur: NEGATIVE mg/dL
Specific Gravity, Urine: 1.025 (ref 1.005–1.030)
Urobilinogen, UA: 0.2 mg/dL (ref 0.0–1.0)

## 2012-12-05 NOTE — Progress Notes (Signed)
     Subjective:    MIKIALA FUGETT is a 27 y.o. female being seen today for her obstetrical visit. She is at 36w4dgestation. Patient reports no bleeding, no contractions, no cramping and no leaking. Fetal movement: normal.  Menstrual History: OB History   Grav Para Term Preterm Abortions TAB SAB Ect Mult Living   2 1 1       1        Patient's last menstrual period was 03/08/2012.    The following portions of the patient's history were reviewed and updated as appropriate: allergies, current medications, past family history, past medical history, past social history, past surgical history and problem list.  Review of Systems Pertinent items are noted in HPI.   Objective:    BP 128/89  Temp(Src) 97.9 F (36.6 C)  Wt 103.602 kg (228 lb 6.4 oz)  BMI 39.19 kg/m2  LMP 03/08/2012 FHT:  140s BPM  Uterine Size: 38 cm and size equals dates  Presentations: cephalic  Pelvic Exam:              Dilation: 2cm       Effacement: 25%   Station:  -3     Consistency: medium            Position: middle     Assessment:  JENGLAND GREBis a 27y.o. G2P1001 at 471w4dor ROB  Plan:   Discussed with Patient:  - Plans to breast feed.  All questions answered. - Continue prenatal vitamins. - Reviewed fetal kick counts Pt to perform daily at a time when the baby is active, lie laterally with both hands on belly in quiet room and count all movements (hiccups, shoulder rolls, obvious kicks, etc); pt is to report to clinic MAU for less than 10 movements felt in a 2 hour time period-pt told as soon as she counts 10 movements the count is complete.  - Routine precautions discussed (depression, infection s/s).   Patient provided with all pertinent phone numbers for emergencies. - RTC for any VB, regular, painful cramps/ctxs occurring at a rate of >2/10 min, fever (100.5 or higher), n/v/d, any pain that is unresolving or worsening, LOF, decreased fetal movement, CP, SOB, edema - schedule for  induction Problems: Patient Active Problem List   Diagnosis Date Noted  . Anemia complicating pregnancy [id number]. Insufficient prenatal care in third trimester 10/10/2012  . Short interval between pregnancies complicating pregnancy, antepartum 10/10/2012  . Supervision of other normal pregnancy 09/25/2012  . Obesity complicating pregnancy [id number]. Late prenatal care 09/25/2012    To Do: 1.  Schedule for induction  [ ]  Vaccines: Flu:  Tdap: recd [ ]  BCM: mirena [ ]  Readiness: baby has a place to sleep, car seat, other baby necessities.  Edu: [x ] PTL precautions; [ ]  BF class; [ ]  childbirth class; [ ]   BF counseling;

## 2012-12-05 NOTE — Telephone Encounter (Signed)
Preadmission screen  

## 2012-12-05 NOTE — Progress Notes (Signed)
Pulse- 73  Edema- feet

## 2012-12-05 NOTE — Patient Instructions (Signed)

## 2012-12-07 ENCOUNTER — Inpatient Hospital Stay (HOSPITAL_COMMUNITY)
Admission: AD | Admit: 2012-12-07 | Discharge: 2012-12-07 | Disposition: A | Discharge status: Home / Self Care | Payer: Medicaid Other | Source: Ambulatory Visit | Attending: Obstetrics & Gynecology | Admitting: Obstetrics & Gynecology

## 2012-12-07 ENCOUNTER — Encounter (HOSPITAL_COMMUNITY): Payer: Self-pay

## 2012-12-07 DIAGNOSIS — O99891 Other specified diseases and conditions complicating pregnancy: Secondary | ICD-10-CM | POA: Insufficient documentation

## 2012-12-07 DIAGNOSIS — R32 Unspecified urinary incontinence: Secondary | ICD-10-CM

## 2012-12-07 DIAGNOSIS — O99211 Obesity complicating pregnancy, first trimester: Secondary | ICD-10-CM

## 2012-12-07 DIAGNOSIS — O99019 Anemia complicating pregnancy, unspecified trimester: Secondary | ICD-10-CM

## 2012-12-07 DIAGNOSIS — O0930 Supervision of pregnancy with insufficient antenatal care, unspecified trimester: Secondary | ICD-10-CM

## 2012-12-07 DIAGNOSIS — O99013 Anemia complicating pregnancy, third trimester: Secondary | ICD-10-CM

## 2012-12-07 NOTE — MAU Note (Signed)
Gush of fluid around 240 am this morning. States wasn't sure if urinated on self, but it has happened a few more times since then. Fluid looked clear. Denies vaginal bleeding. Positive fetal movement.

## 2012-12-07 NOTE — Progress Notes (Signed)
None     Chief Complaint:  Rupture of Membranes   Jocelyn Garcia is  27 y.o. G2P1001 at 40w6dpresents complaining of possible  Rupture of Membranes She was in the kitchen fixing her 27year old a bottle and liquid came out of her vagina.  She felt like she has leaked a few times since.  No contractions.    Obstetrical/Gynecological History: OB History   Grav Para Term Preterm Abortions TAB SAB Ect Mult Living   2 1 1       1      Past Medical History: Past Medical History  Diagnosis Date  . Anemia   . Asthma     exercise induced  . Urinary tract infection   . Chronic kidney disease   . Kidney stones     Past Surgical History: Past Surgical History  Procedure Laterality Date  . Wisdom tooth extraction      Family History: Family History  Problem Relation Age of Onset  . Anesthesia problems Neg Hx   . Heart disease Mother   . Hypertension Mother   . Diabetes Mother   . Heart disease Maternal Grandmother   . Stroke Maternal Grandmother   . Hypertension Maternal Grandmother   . Cancer Maternal Grandfather     Social History: History  Substance Use Topics  . Smoking status: Former Smoker    Quit date: 03/09/2011  . Smokeless tobacco: Never Used  . Alcohol Use: No    Allergies: No Known Allergies  Meds:  Prescriptions prior to admission  Medication Sig Dispense Refill  . albuterol (PROVENTIL HFA;VENTOLIN HFA) 108 (90 BASE) MCG/ACT inhaler Inhale 2 puffs into the lungs every 6 (six) hours as needed. For shortness of breath  1 Inhaler  3  . IRON PO Take 1 tablet by mouth 2 (two) times daily.       . Prenatal 27-1 MG TABS Take 1 tablet by mouth every morning.  60 each  0    Review of Systems   Constitutional: Negative for fever and chills Eyes: Negative for visual disturbances Respiratory: Negative for shortness of breath, dyspnea Cardiovascular: Negative for chest pain or palpitations  Gastrointestinal: Negative for vomiting, diarrhea and  constipation Genitourinary: Negative for dysuria and urgency Musculoskeletal: Negative for back pain, joint pain, myalgias  Neurological: Negative for dizziness and headaches      Physical Exam  Blood pressure 134/86, pulse 74, temperature 98.6 F (37 C), temperature source Oral, resp. rate 18, height 5' 5.5" (1.664 m), weight 104.69 kg (230 lb 12.8 oz), last menstrual period 03/08/2012. GENERAL: Well-developed, well-nourished female in no acute distress.  LUNGS: Clear to auscultation bilaterally.  HEART: Regular rate and rhythm. ABDOMEN: Soft, nontender, nondistended, gravid.  EXTREMITIES: Nontender, no edema, 2+ distal pulses. DTR's 2+ CERVICAL EXAM:deferred.  Visually closed SSE:  Vagina dry, scant discharge; no pooling, negative fern and valsalva FHT:  Baseline rate 140 bpm   Variability moderate  Accelerations present   Decelerations none Contractions: Every 0 mins   Labs: No results found for this or any previous visit (from the past 24 hour(s)). Imaging Studies:  No results found.  Assessment: JJELISHA WEEDis  27y.o. G2P1001 at 435w6dresents with no evidence of ROM.  Plan: D/C home.  Scheduled IOL for Saturday am  CRESENZO-DISHMAN,Jazmin Ley 9/5/20144:37 AM

## 2012-12-08 ENCOUNTER — Inpatient Hospital Stay (HOSPITAL_COMMUNITY)
Admission: RE | Admit: 2012-12-08 | Discharge: 2012-12-10 | DRG: 775 | Disposition: A | Discharge status: Home / Self Care | Payer: Medicaid Other | Source: Ambulatory Visit | Attending: Family Medicine | Admitting: Family Medicine

## 2012-12-08 ENCOUNTER — Encounter (HOSPITAL_COMMUNITY): Payer: Self-pay

## 2012-12-08 VITALS — BP 131/84 | HR 99 | Temp 98.0°F | Resp 18 | Ht 65.0 in | Wt 230.0 lb

## 2012-12-08 DIAGNOSIS — O480 Post-term pregnancy: Principal | ICD-10-CM | POA: Diagnosis present

## 2012-12-08 DIAGNOSIS — D649 Anemia, unspecified: Secondary | ICD-10-CM | POA: Diagnosis present

## 2012-12-08 DIAGNOSIS — O9902 Anemia complicating childbirth: Secondary | ICD-10-CM | POA: Diagnosis present

## 2012-12-08 DIAGNOSIS — O0930 Supervision of pregnancy with insufficient antenatal care, unspecified trimester: Secondary | ICD-10-CM

## 2012-12-08 LAB — COMPREHENSIVE METABOLIC PANEL
ALT: 8 U/L (ref 0–35)
Alkaline Phosphatase: 159 U/L — ABNORMAL HIGH (ref 39–117)
BUN: 9 mg/dL (ref 6–23)
CO2: 21 mEq/L (ref 19–32)
Chloride: 102 mEq/L (ref 96–112)
GFR calc non Af Amer: >90 mL/min (ref >90)
Glucose, Bld: 77 mg/dL (ref 70–99)
Potassium: 3.9 mEq/L (ref 3.5–5.1)
Sodium: 135 mEq/L (ref 135–145)
Total Bilirubin: 0.2 mg/dL — ABNORMAL LOW (ref 0.3–1.2)
Total Protein: 6 g/dL (ref 6.0–8.3)

## 2012-12-08 LAB — TYPE AND SCREEN

## 2012-12-08 LAB — CBC
HCT: 27.3 % — ABNORMAL LOW (ref 36.0–46.0)
MCHC: 31.5 g/dL (ref 30.0–36.0)
MCV: 76.3 fL — ABNORMAL LOW (ref 78.0–100.0)
Platelets: 213 10*3/uL (ref 150–400)
RDW: 16.1 % — ABNORMAL HIGH (ref 11.5–15.5)
WBC: 10.6 10*3/uL — ABNORMAL HIGH (ref 4.0–10.5)

## 2012-12-08 MED ORDER — ONDANSETRON HCL 4 MG/2ML IJ SOLN: 4.0000 mg | INTRAMUSCULAR | Status: DC | PRN

## 2012-12-08 MED ORDER — ZOLPIDEM TARTRATE 5 MG PO TABS: 5.0000 mg | ORAL_TABLET | Freq: Every evening | ORAL | Status: DC | PRN

## 2012-12-08 MED ORDER — OXYTOCIN BOLUS FROM INFUSION
500.0000 mL | INTRAVENOUS | Status: DC
  Administered 2012-12-08: 500 mL via INTRAVENOUS

## 2012-12-08 MED ORDER — OXYTOCIN 40 UNITS IN LACTATED RINGERS INFUSION - SIMPLE MED
62.5000 mL/h | INTRAVENOUS | Status: DC
  Administered 2012-12-08: 62.5 mL/h via INTRAVENOUS
  Filled 2012-12-08: qty 1000

## 2012-12-08 MED ORDER — FLEET ENEMA 7-19 GM/118ML RE ENEM: 1.0000 | ENEMA | RECTAL | Status: DC | PRN

## 2012-12-08 MED ORDER — LACTATED RINGERS IV SOLN: 500.0000 mL | INTRAVENOUS | Status: DC | PRN

## 2012-12-08 MED ORDER — LANOLIN HYDROUS EX OINT: TOPICAL_OINTMENT | CUTANEOUS | Status: DC | PRN

## 2012-12-08 MED ORDER — SENNOSIDES-DOCUSATE SODIUM 8.6-50 MG PO TABS
2.0000 | ORAL_TABLET | Freq: Every day | ORAL | Status: DC
  Administered 2012-12-09: 2 via ORAL

## 2012-12-08 MED ORDER — DIPHENHYDRAMINE HCL 25 MG PO CAPS: 25.0000 mg | ORAL_CAPSULE | Freq: Four times a day (QID) | ORAL | Status: DC | PRN

## 2012-12-08 MED ORDER — PROMETHAZINE HCL 25 MG/ML IJ SOLN: 25.0000 mg | Freq: Four times a day (QID) | INTRAMUSCULAR | Status: DC | PRN

## 2012-12-08 MED ORDER — SIMETHICONE 80 MG PO CHEW: 80.0000 mg | CHEWABLE_TABLET | ORAL | Status: DC | PRN

## 2012-12-08 MED ORDER — TETANUS-DIPHTH-ACELL PERTUSSIS 5-2.5-18.5 LF-MCG/0.5 IM SUSP: 0.5000 mL | Freq: Once | INTRAMUSCULAR | Status: DC

## 2012-12-08 MED ORDER — ONDANSETRON HCL 4 MG/2ML IJ SOLN
4.0000 mg | Freq: Four times a day (QID) | INTRAMUSCULAR | Status: DC | PRN
  Administered 2012-12-08: 4 mg via INTRAVENOUS
  Filled 2012-12-08: qty 2

## 2012-12-08 MED ORDER — HYDROXYZINE HCL 50 MG PO TABS
50.0000 mg | ORAL_TABLET | Freq: Four times a day (QID) | ORAL | Status: DC | PRN
  Filled 2012-12-08: qty 1

## 2012-12-08 MED ORDER — WITCH HAZEL-GLYCERIN EX PADS: 1.0000 "application " | MEDICATED_PAD | CUTANEOUS | Status: DC | PRN

## 2012-12-08 MED ORDER — ACETAMINOPHEN 325 MG PO TABS: 650.0000 mg | ORAL_TABLET | ORAL | Status: DC | PRN

## 2012-12-08 MED ORDER — MISOPROSTOL 25 MCG QUARTER TABLET
25.0000 ug | ORAL_TABLET | ORAL | Status: DC | PRN
  Administered 2012-12-08: 25 ug via VAGINAL
  Filled 2012-12-08: qty 0.25
  Filled 2012-12-08: qty 1

## 2012-12-08 MED ORDER — PRENATAL MULTIVITAMIN CH
1.0000 | ORAL_TABLET | Freq: Every day | ORAL | Status: DC
Start: 2012-12-09 — End: 2012-12-10
  Administered 2012-12-09: 1 via ORAL
  Filled 2012-12-08: qty 1

## 2012-12-08 MED ORDER — OXYCODONE-ACETAMINOPHEN 5-325 MG PO TABS
1.0000 | ORAL_TABLET | ORAL | Status: DC | PRN
  Administered 2012-12-08 – 2012-12-10 (×6): 1 via ORAL
  Filled 2012-12-08 (×7): qty 1

## 2012-12-08 MED ORDER — SILVER SULFADIAZINE 1 % EX CREA
TOPICAL_CREAM | Freq: Every day | CUTANEOUS | Status: DC | PRN
  Filled 2012-12-08: qty 25

## 2012-12-08 MED ORDER — IBUPROFEN 600 MG PO TABS: 600.0000 mg | ORAL_TABLET | Freq: Four times a day (QID) | ORAL | Status: DC

## 2012-12-08 MED ORDER — LIDOCAINE HCL (PF) 1 % IJ SOLN
30.0000 mL | INTRAMUSCULAR | Status: DC | PRN
  Filled 2012-12-08 (×2): qty 30

## 2012-12-08 MED ORDER — FENTANYL CITRATE 0.05 MG/ML IJ SOLN
100.0000 ug | INTRAMUSCULAR | Status: DC | PRN
  Administered 2012-12-08 (×4): 100 ug via INTRAVENOUS
  Filled 2012-12-08 (×4): qty 2

## 2012-12-08 MED ORDER — PROMETHAZINE HCL 25 MG/ML IJ SOLN
12.5000 mg | Freq: Four times a day (QID) | INTRAMUSCULAR | Status: DC | PRN
  Administered 2012-12-08: 12.5 mg via INTRAVENOUS
  Filled 2012-12-08: qty 1

## 2012-12-08 MED ORDER — OXYCODONE-ACETAMINOPHEN 5-325 MG PO TABS: 1.0000 | ORAL_TABLET | ORAL | Status: DC | PRN

## 2012-12-08 MED ORDER — IBUPROFEN 600 MG PO TABS
600.0000 mg | ORAL_TABLET | Freq: Four times a day (QID) | ORAL | Status: DC
  Administered 2012-12-09 – 2012-12-10 (×5): 600 mg via ORAL
  Filled 2012-12-08 (×5): qty 1

## 2012-12-08 MED ORDER — LACTATED RINGERS IV SOLN: INTRAVENOUS | Status: DC

## 2012-12-08 MED ORDER — CITRIC ACID-SODIUM CITRATE 334-500 MG/5ML PO SOLN
30.0000 mL | ORAL | Status: DC | PRN
  Administered 2012-12-08: 30 mL via ORAL
  Filled 2012-12-08: qty 15

## 2012-12-08 MED ORDER — ALBUTEROL SULFATE HFA 108 (90 BASE) MCG/ACT IN AERS
2.0000 | INHALATION_SPRAY | Freq: Four times a day (QID) | RESPIRATORY_TRACT | Status: DC | PRN
  Filled 2012-12-08: qty 6.7

## 2012-12-08 MED ORDER — BENZOCAINE-MENTHOL 20-0.5 % EX AERO: 1.0000 "application " | INHALATION_SPRAY | CUTANEOUS | Status: DC | PRN

## 2012-12-08 MED ORDER — DIBUCAINE 1 % RE OINT: 1.0000 "application " | TOPICAL_OINTMENT | RECTAL | Status: DC | PRN

## 2012-12-08 MED ORDER — IBUPROFEN 600 MG PO TABS
600.0000 mg | ORAL_TABLET | Freq: Four times a day (QID) | ORAL | Status: DC | PRN
  Administered 2012-12-08: 600 mg via ORAL
  Filled 2012-12-08: qty 1

## 2012-12-08 MED ORDER — TERBUTALINE SULFATE 1 MG/ML IJ SOLN: 0.2500 mg | Freq: Once | INTRAMUSCULAR | Status: DC | PRN

## 2012-12-08 MED ORDER — MEASLES, MUMPS & RUBELLA VAC ~~LOC~~ INJ
0.5000 mL | INJECTION | Freq: Once | SUBCUTANEOUS | Status: DC
  Filled 2012-12-08: qty 0.5

## 2012-12-08 MED ORDER — ONDANSETRON HCL 4 MG PO TABS: 4.0000 mg | ORAL_TABLET | ORAL | Status: DC | PRN

## 2012-12-08 NOTE — H&P (Signed)
Jocelyn Garcia is a 27 y.o. G60P1001 female at 67w0dby 30.3wk u/s, presenting for IOL d/t postdates.  She initiated pnc at [facility] 30.3wks, too late for genetic screening, anatomy u/s normal, 1hr gtt 134, gbs neg. Pregnancy has been complicated by late pnc and anemia-taking fe supplement. Wants to try for unmedicated birth. She had a term uncomplicated svd of 7lb 10.3TDbaby last year. She plans to breast feed and mirena for contraception.   Maternal Medical History:  Reason for admission: IOL d/t postdates  Fetal activity: Perceived fetal activity is normal.   Last perceived fetal movement was within the past hour.    Prenatal complications: Late care @ 30.3wks, anemia  Prenatal Complications - Diabetes: none.    OB History   Grav Para Term Preterm Abortions TAB SAB Ect Mult Living   2 1 1       1      Past Medical History  Diagnosis Date  . Anemia   . Asthma     exercise induced  . Urinary tract infection   . Chronic kidney disease   . Kidney stones    Past Surgical History  Procedure Laterality Date  . Wisdom tooth extraction     Family History: family history includes Cancer in her maternal grandfather; Diabetes in her mother; Heart disease in her maternal grandmother and mother; Hypertension in her maternal grandmother and mother; Stroke in her maternal grandmother. There is no history of Anesthesia problems. Social History:  reports that she quit smoking about 21 months ago. She has never used smokeless tobacco. She reports that she does not drink alcohol or use illicit drugs.   Review of Systems  Constitutional: Negative.   HENT: Negative.   Eyes: Negative.   Respiratory: Negative.   Cardiovascular: Negative.   Gastrointestinal: Negative.   Genitourinary: Negative.   Musculoskeletal: Negative.   Skin: Negative.   Neurological: Negative.   Endo/Heme/Allergies: Negative.   Psychiatric/Behavioral: Negative.     Dilation: 2 Effacement (%): 60 Station: -3 Exam  by:: Dr pKennon RoundsBlood pressure 133/85, pulse 81, temperature 97.8 F (36.6 C), temperature source Oral, resp. rate 20, height 5' 5"  (1.651 m), weight 104.327 kg (230 lb), last menstrual period 03/08/2012. Maternal Exam:  Uterine Assessment: Contraction strength is mild.  Contraction frequency is irregular.   Abdomen: Patient reports no abdominal tenderness. Fetal presentation: vertex  Introitus: Normal vulva. Normal vagina.  Pelvis: adequate for delivery.   Cervix: Cervix evaluated by digital exam.   By dr. pKennon Rounds Fetal Exam Fetal Monitor Review: Mode: ultrasound.   Baseline rate: 125.  Variability: moderate (6-25 bpm).   Pattern: accelerations present and no decelerations.    Fetal State Assessment: Category I - tracings are normal.     Physical Exam  Constitutional: She is oriented to person, place, and time. She appears well-developed and well-nourished.  HENT:  Head: Normocephalic.  Neck: Normal range of motion.  Cardiovascular: Normal rate and regular rhythm.   Respiratory: Effort normal and breath sounds normal.  GI: Soft.  gravid  Genitourinary:  Sve: 2/60/-3 by dr. pKennon Rounds Musculoskeletal: Normal range of motion.  Neurological: She is alert and oriented to person, place, and time. She has normal reflexes.  Skin: Skin is warm and dry.  Psychiatric: She has a normal mood and affect. Her behavior is normal. Judgment and thought content normal.    Prenatal labs: ABO, Rh: A/POS/-- (04/22 1045) Antibody: NEG (04/22 1045) Rubella: 1.53 (04/22 1045) RPR: NON REAC (04/22 1045)  HBsAg: NEGATIVE (04/22 1045)  HIV: NON REACTIVE (04/22 1045)  GBS: Negative (08/06 0000)   Assessment/Plan: A:   83w0dSIUP  G2P1001   IOL d/t postdates  Cat I FHR  GBS neg  Short interval pregnancy  Late pnc  Anemia  P:   Admit to BS  Cytotec per protocol  Anticipate NSVD     BTawnya Crook9/09/2012, 9:07 AM

## 2012-12-08 NOTE — Progress Notes (Signed)
Jocelyn Garcia is a 27 y.o. G2P1001 at 52w0dadmitted for induction of labor due to Post dates. Due date 12/01/12.  Subjective: Uncomfortable w/ uc's, requesting iv pain meds. Doesn't want epidural.  Gave option of expectant management at this point vs. arom, pt desires arom  Objective: BP 141/79  Pulse 65  Temp(Src) 98.3 F (36.8 C) (Oral)  Resp 18  Ht 5' 5"  (1.651 m)  Wt 104.327 kg (230 lb)  BMI 38.27 kg/m2  LMP 03/08/2012      FHT:  FHR: 125 bpm, variability: moderate,  accelerations:  Present,  decelerations:  Absent UC:   regular, every 1-3 minutes SVE:   Dilation: 4 Effacement (%): 80 Station: -2 Exam by:: K Hooker cnm AROM small amount clear fluid  Labs: Lab Results  Component Value Date   WBC 10.6* 12/08/2012   HGB 8.6* 12/08/2012   HCT 27.3* 12/08/2012   MCV 76.3* 12/08/2012   PLT 213 12/08/2012    Assessment / Plan: IOL d/t postdates, s/p 1 dose of pr cytotec, laboring- arom'd per pt request  Labor: Progressing normally Preeclampsia:  n/a Fetal Wellbeing:  Category I Pain Control:  requesting iv pain med I/D:  n/a Anticipated MOD:  NSVD  BTawnya Crook9/09/2012, 2:14 PM

## 2012-12-08 NOTE — Progress Notes (Signed)
Jocelyn Garcia is a 27 y.o. G2P1001 at 59w0dadmitted for induction of labor due to Post dates. Due date 12/01/12.  Subjective: Uncomfortable w/ uc's, breathing well, has had 3 doses of fentanyl. Has had some nausea and dry-heaving, recently received phenergan and feels like nausea is getting better.  Denies ha, scotomata, ruq/epigastric pain.    Objective: BP 147/90  Pulse 58  Temp(Src) 98.5 F (36.9 C) (Axillary)  Resp 20  Ht 5' 5"  (1.651 m)  Wt 104.327 kg (230 lb)  BMI 38.27 kg/m2  LMP 03/08/2012      FHT:  FHR: 125 bpm, variability: moderate,  accelerations:  Present,  decelerations:  Present earlies UC:   regular, every 2-3 minutes SVE:   Dilation: 7 Effacement (%): 80 Station: -1 Exam by:: K Maila Dukes cnm  DTRs 1-2+, no clonus, tr edema BLE  Labs: Lab Results  Component Value Date   WBC 10.6* 12/08/2012   HGB 8.6* 12/08/2012   HCT 27.3* 12/08/2012   MCV 76.3* 12/08/2012   PLT 213 12/08/2012    Assessment / Plan: IOL d/t postdates, 1 dose of cytotec this am, then arom- progressing well  Labor: Progressing normally Preeclampsia:  no s/s, elevated bp's, labs pending Fetal Wellbeing:  Category I Pain Control:  Fentanyl I/D:  n/a Anticipated MOD:  NSVD  Jocelyn Crook9/09/2012, 7:14 PM

## 2012-12-08 NOTE — H&P (Signed)
Chart reviewed and agree with management and plan.

## 2012-12-09 LAB — CBC
HCT: 26.4 % — ABNORMAL LOW (ref 36.0–46.0)
MCH: 24.3 pg — ABNORMAL LOW (ref 26.0–34.0)
MCV: 76.5 fL — ABNORMAL LOW (ref 78.0–100.0)
RDW: 16.6 % — ABNORMAL HIGH (ref 11.5–15.5)
WBC: 16.9 10*3/uL — ABNORMAL HIGH (ref 4.0–10.5)

## 2012-12-09 MED ORDER — FERROUS SULFATE 325 (65 FE) MG PO TABS
325.0000 mg | ORAL_TABLET | Freq: Two times a day (BID) | ORAL | Status: DC
  Administered 2012-12-09 – 2012-12-10 (×3): 325 mg via ORAL
  Filled 2012-12-09 (×3): qty 1

## 2012-12-09 NOTE — Progress Notes (Signed)
Clinical Social Work Department PSYCHOSOCIAL ASSESSMENT - MATERNAL/CHILD 12/09/2012  Patient:  Jocelyn Garcia, Jocelyn Garcia  Account Number:  [id number]  Admit Date:  12/08/2012  Ardine Eng Name:   Karen Kays    Clinical Social Worker:  Megean Fabio, LCSW   Date/Time:  12/09/2012 11:15 AM  Date Referred:  12/08/2012   Referral source  Central Nursery     Referred reason  [facility]   Other referral source:    I:  FAMILY / HOME ENVIRONMENT Child's legal guardian:  PARENT  Guardian - Name [place] - Age [place] [address] [place], [place] [address]   Other household support members/support persons Name Relationship DOB  Sadie Mercie Eon  16 months   Other support:   grandparents    II  PSYCHOSOCIAL DATA Information Source:  Patient Interview  Occupational hygienist Employment:   Museum/gallery curator resources:  [employer]:   Other  [place] / Grade:   Maternity Care Coordinator / Child Services Coordination / Early Interventions:  Cultural issues impacting care:  None reported  III  STRENGTHS  Strength comment:  Family Support     V  SOCIAL WORK ASSESSMENT Met with mother who was pleasant and receptive to social work intervention.    Parents are not married, but maintain a relationship.  Mother lives with her parents and 38 month old.   She is unemployed and states that she is being supported by FOB and parents.  She denies any hx of mental illness or family hx of mental illness.  She report hx of using marijuana.   Informed that last use was in 2012.   Spoke with RN caring for mother and informed that Urine drug screen was ordered on newborn.  Mother was informed of this.   Mother states that she got late [facility] because she did not find out she was pregnant until late in the pregnancy and it took her 3 weeks to resolve the insurance issue so that she could schedule apt for [facility].   Mother states that she  is well prepared at home for newborn.   No acute social concerns related at this time.     VI SOCIAL WORK PLAN  Information/referral to community resources comment:   Pediatrician:  [place]   Other social work plan:   CSW will continue to follow PRN    Geoff Dacanay J, LCSW

## 2012-12-09 NOTE — Lactation Note (Signed)
This note was copied from the chart of Jocelyn Kayelyn Lemon. Lactation Consultation Note   Initial consult with this mom and baby, now 17 hours post partum. Baby is sleepy right now, but has breast well for a few hours after birth. Voiding and stooling well. Mom denies and discomfort, and did not want to try and latch while I was in room. [place] teaching on breast feeding -cue based feeding, skin to skin - reviewed with mom, as well as lactation services review and community services.  Patient Name: Jocelyn Garcia [id number] Date: 12/09/2012 Reason for consult: Initial assessment   Maternal Data Formula Feeding for Exclusion: No Infant to breast within first hour of birth: Yes Has patient been taught Hand Expression?: No Does the patient have breastfeeding experience prior to this delivery?: Yes  Feeding    LATCH Score/Interventions                      Lactation Tools Discussed/Used     Consult Status Consult Status: Follow-up Date: 12/09/12 Follow-up type: In-patient    Tonna Corner 12/09/2012, 2:49 PM
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
## 2012-12-10 MED ORDER — IBUPROFEN 600 MG PO TABS: 600.0000 mg | ORAL_TABLET | Freq: Four times a day (QID) | ORAL | Status: DC

## 2012-12-10 NOTE — Discharge Summary (Signed)
Obstetric Discharge Summary Reason for Admission: induction of labor and for post dates Prenatal Procedures: NST Intrapartum Procedures: spontaneous vaginal delivery Postpartum Procedures: none Complications-Operative and Postpartum: none and unattended delivery, Mild elevated BP PP Hemoglobin  Date Value Range Status  12/09/2012 8.4* 12.0 - 15.0 g/dL Final     HCT  Date Value Range Status  12/09/2012 26.4* 36.0 - 46.0 % Final    Physical Exam:  General: alert, cooperative, appears stated age and no distress Lochia: appropriate Uterine Fundus: firm U-2 DVT Evaluation: No evidence of DVT seen on physical exam. Negative Homan's sign. No cords or calf tenderness. No significant calf/ankle edema.  Discharge Diagnoses: Post-date pregnancy  Discharge Information: Date: 12/10/2012 Activity: pelvic rest Diet: routine Medications: PNV, Ibuprofen and Iron Condition: stable Instructions: refer to practice specific booklet Discharge to: home MOC: Mirena declines interim BC MOF: Breast HTN: Elevated BP will send baby love check for HTN  Follow-up Information   Follow up with [facility] In 4 weeks.   Specialty:  Obstetrics and Gynecology   Contact information:   [place] [address] [phone]      Newborn Data: Live born female  Birth Weight: 7 lb 4 oz (3289 g) APGAR: 9, 9  Home with mother.  Fredrik Rigger 12/10/2012, 7:15 AM

## 2012-12-10 NOTE — Progress Notes (Signed)
Ur chart review completed.  

## 2012-12-11 ENCOUNTER — Telehealth: Payer: Self-pay | Admitting: *Deleted

## 2012-12-11 NOTE — Telephone Encounter (Addendum)
Pt left message stating that she had her baby and was discharged with motrin for pain. She says she is having cramping and soreness and was told to call if she needed a stronger pain med. Please call back.  9/10  2458 - pt left additional message yesterday requesting pain medication. She gave her contact number as [phone].  I called her this morning and heard message stating that pt has a voice mailbox which has not been set up yet.  I also called the number [phone] (from her original call) and got the voice mail of a female. I left message stating that I am returning Keita's call.  Pt returned my call 10 min later and we discussed her concerns. She stated that she is taking the ibuprofen every 6 hrs as prescribed and although it does help take the edge off of the cramping, she is still having discomfort. I advised pt that she may also take Tylenol every 6 hrs and she should alternate with the Ibuprofen so that she is taking pain med every 3 hrs (tylenol, then ibuprofen)  Pt also stated that she is not having much bleeding but she is passing some small clots. I stated that this is normal. I advised pt that she should go to MAU for the following; stronger cramping which is not relieved by the ibuprofen and tylenol, heavier bleeding or increased amount of passing clots.  Pt voiced understanding of all information and instructions.

## 2013-01-11 ENCOUNTER — Encounter: Payer: Self-pay | Admitting: Advanced Practice Midwife

## 2013-01-11 ENCOUNTER — Ambulatory Visit (INDEPENDENT_AMBULATORY_CARE_PROVIDER_SITE_OTHER): Payer: Medicaid Other | Admitting: Advanced Practice Midwife

## 2013-01-11 VITALS — Temp 97.6°F | Ht 66.0 in | Wt 202.8 lb

## 2013-01-11 DIAGNOSIS — Z01812 Encounter for preprocedural laboratory examination: Secondary | ICD-10-CM

## 2013-01-11 DIAGNOSIS — IMO0001 Reserved for inherently not codable concepts without codable children: Secondary | ICD-10-CM

## 2013-01-11 DIAGNOSIS — Z30430 Encounter for insertion of intrauterine contraceptive device: Secondary | ICD-10-CM

## 2013-01-11 MED ORDER — LEVONORGESTREL 20 MCG/24HR IU IUD
INTRAUTERINE_SYSTEM | Freq: Once | INTRAUTERINE | Status: AC
  Administered 2013-01-11: 12:00:00 1 via INTRAUTERINE

## 2013-01-11 NOTE — Progress Notes (Signed)
  Subjective:     Jocelyn Garcia is a 27 y.o. female who presents for a postpartum visit. She is 4 weeks postpartum following a spontaneous vaginal delivery. I have fully reviewed the prenatal and intrapartum course. The delivery was at 26 gestational weeks. Outcome: spontaneous vaginal delivery; delivery unattended.  Anesthesia: IV pain medication. Postpartum course has been unremarkable. Baby's course has been unremarkable. Baby is feeding by both breast and bottle - Similac Sensitive RS. Bleeding - spotting.  Bowel function is normal. Bladder function is normal. Patient is not sexually active currently.  Contraception method - IUD Today.  Postpartum depression screening:Negative  Review of Systems A comprehensive review of systems was negative.   Objective:    Temp(Src) 97.6 F (36.4 C) (Oral)  Ht 5' 6"  (1.676 m)  Wt 202 lb 12.8 oz (91.989 kg)  BMI 32.75 kg/m2  Breastfeeding? Yes  General:  alert, cooperative and no distress  Lungs: clear to auscultation bilaterally  Heart:  regular rate and rhythm, S1, S2 normal, no murmur, click, rub or gallop  Abdomen: soft, non-tender; bowel sounds normal; no masses,  no organomegaly   Vulva:  normal  Vagina: normal vagina  Cervix:   normal, no lesions.         GYNECOLOGY CLINIC PROCEDURE NOTE  Jocelyn Garcia is a 27 y.o. [phone] here for Mirena IUD insertion.  IUD Insertion Procedure Note Patient identified, informed consent performed.  Discussed risks of irregular bleeding, cramping, infection, malpositioning or misplacement of the IUD outside the uterus which may require further procedures. Time out was performed.   Speculum placed in the vagina.  Cervix visualized.  Cleaned with Betadine x 2.  Grasped anteriorly with a single tooth tenaculum.  Uterus sounded to 8 cm.  Mirena IUD placed per manufacturer's recommendations.  Strings trimmed to ~ 3 cm. Tenaculum was removed, good hemostasis noted.  Patient tolerated procedure well.    Patient was given post-procedure instructions.  Patient was also asked to check IUD strings periodically and follow up in 4 weeks for IUD check.  Assessment:     4 week postpartum exam. Pap smear not done at today's visit (done on 09/2012).   Plan:    1. Contraception: IUD   Follow up in ~ 4 weeks for string check.

## 2013-01-23 ENCOUNTER — Encounter: Payer: Self-pay | Admitting: *Deleted

## 2013-01-23 ENCOUNTER — Telehealth: Payer: Self-pay

## 2013-01-23 NOTE — Telephone Encounter (Signed)
Called patient and discussed her bleeding and cramping. Pt. Stated the bleeding is light and spotting at times and the cramping comes and goes; stated she felt her lower pelvic area was aching/bloating for about an hour this morning and then subsided. Explained to patient that it is normal after pregnancy and IUD insertion to bleed irregularly and that it may take 3-6 months for the body to normalize. Advised patient to go to MAU if bleeding was heavy and saturating a pad an hour or if the cramping became continuous and intolerable. All questions were answered and Pt. Verbalized understanding and had no further questions.

## 2013-01-23 NOTE — Telephone Encounter (Signed)
Pt. Called stating she had a baby in September, had mirena placed on 01/11/13 and has been bleeding and cramping since then. Would like to know if this is normal.

## 2013-02-01 ENCOUNTER — Emergency Department (HOSPITAL_COMMUNITY)
Admission: EM | Admit: 2013-02-01 | Discharge: 2013-02-01 | Disposition: A | Discharge status: Home / Self Care | Payer: Medicaid Other | Attending: Emergency Medicine | Admitting: Emergency Medicine

## 2013-02-01 ENCOUNTER — Encounter (HOSPITAL_COMMUNITY): Payer: Self-pay | Admitting: Emergency Medicine

## 2013-02-01 DIAGNOSIS — Z87891 Personal history of nicotine dependence: Secondary | ICD-10-CM | POA: Insufficient documentation

## 2013-02-01 DIAGNOSIS — K0889 Other specified disorders of teeth and supporting structures: Secondary | ICD-10-CM

## 2013-02-01 DIAGNOSIS — K089 Disorder of teeth and supporting structures, unspecified: Secondary | ICD-10-CM | POA: Insufficient documentation

## 2013-02-01 DIAGNOSIS — J45909 Unspecified asthma, uncomplicated: Secondary | ICD-10-CM | POA: Insufficient documentation

## 2013-02-01 DIAGNOSIS — Z79899 Other long term (current) drug therapy: Secondary | ICD-10-CM | POA: Insufficient documentation

## 2013-02-01 DIAGNOSIS — Z87440 Personal history of urinary (tract) infections: Secondary | ICD-10-CM | POA: Insufficient documentation

## 2013-02-01 DIAGNOSIS — Z862 Personal history of diseases of the blood and blood-forming organs and certain disorders involving the immune mechanism: Secondary | ICD-10-CM | POA: Insufficient documentation

## 2013-02-01 DIAGNOSIS — Z87442 Personal history of urinary calculi: Secondary | ICD-10-CM | POA: Insufficient documentation

## 2013-02-01 MED ORDER — OXYCODONE-ACETAMINOPHEN 5-325 MG PO TABS
2.0000 | ORAL_TABLET | Freq: Once | ORAL | Status: AC
  Administered 2013-02-01: 2 via ORAL
  Filled 2013-02-01: qty 2

## 2013-02-01 MED ORDER — OXYCODONE-ACETAMINOPHEN 5-325 MG PO TABS: 1.0000 | ORAL_TABLET | Freq: Four times a day (QID) | ORAL | Status: DC | PRN

## 2013-02-01 NOTE — ED Provider Notes (Signed)
Medical screening examination/treatment/procedure(s) were performed by non-physician practitioner and as supervising physician I was immediately available for consultation/collaboration.   Leota Jacobsen, MD 02/01/13 [phone]

## 2013-02-01 NOTE — ED Notes (Addendum)
Pt reports she felt something chip in her upper back L tooth near where she had a filling on Thursday and has been having pain since. Does not have a dentist

## 2013-02-01 NOTE — ED Provider Notes (Signed)
CSN: [id number]     Arrival date & time 02/01/13  1216 History  This chart was scribed for Michele Mcalpine, PA working with Leota Jacobsen, MD by Roxan Diesel, ED Scribe. This patient was seen in room TR07C/TR07C and the patient's care was started at 12:55 PM.   Chief Complaint  Patient presents with  . Dental Pain    The history is provided by the patient. No language interpreter was used.    HPI Comments: Jocelyn Garcia is a 27 y.o. female who presents to the Emergency Department complaining of severe upper right-sided dental pain that began yesterday.  Pt states she had a tooth in that area filled over a year ago and yesterday she felt part of another tooth in that area chip off.  Since then she has had constant, progressively-worsening pain rated at a severity of 8-9/10.  She has been taking ibuprofen, with minimal relief.  Pt does not have a dentist because she does not have insurance.  She is breast-feeding.   She does not smoke.   Past Medical History  Diagnosis Date  . Anemia   . Asthma     exercise induced  . Urinary tract infection   . Chronic kidney disease   . Kidney stones     Past Surgical History  Procedure Laterality Date  . Wisdom tooth extraction      Family History  Problem Relation Age of Onset  . Anesthesia problems Neg Hx   . Heart disease Mother   . Hypertension Mother   . Diabetes Mother   . Heart disease Maternal Grandmother   . Stroke Maternal Grandmother   . Hypertension Maternal Grandmother   . Cancer Maternal Grandfather     History  Substance Use Topics  . Smoking status: Former Smoker    Quit date: 03/09/2011  . Smokeless tobacco: Never Used  . Alcohol Use: No    OB History   Grav Para Term Preterm Abortions TAB SAB Ect Mult Living   2 2 2       2       Review of Systems  HENT: Positive for dental problem.   All other systems reviewed and are negative.     Allergies  Review of patient's allergies indicates no known  allergies.  Home Medications   Current Outpatient Rx  Name  Route  Sig  Dispense  Refill  . albuterol (PROVENTIL HFA;VENTOLIN HFA) 108 (90 BASE) MCG/ACT inhaler   Inhalation   Inhale 2 puffs into the lungs every 6 (six) hours as needed. For shortness of breath   1 Inhaler   3   . ibuprofen (ADVIL,MOTRIN) 600 MG tablet   Oral   Take 1 tablet (600 mg total) by mouth every 6 (six) hours.   90 tablet   1   . IRON PO   Oral   Take 1 tablet by mouth 2 (two) times daily.          . Prenatal Vit-Fe Fumarate-FA (PRENATAL MULTIVITAMIN) TABS tablet   Oral   Take 1 tablet by mouth daily at 12 noon.          BP 130/82  Pulse 84  Temp(Src) 98.3 F (36.8 C) (Oral)  SpO2 99%  Physical Exam  Nursing note and vitals reviewed. Constitutional: She is oriented to person, place, and time. She appears well-developed and well-nourished. No distress.  HENT:  Head: Normocephalic.  Mouth/Throat: Oropharynx is clear and moist.  Right upper 2nd molar is chipped,  exposing dentin.  No surrounding erythema or edema evident of infection.  No abscess.  Eyes: Conjunctivae and EOM are normal.  Neck: Normal range of motion. Neck supple.  Cardiovascular: Normal rate, regular rhythm and normal heart sounds.   Pulmonary/Chest: Effort normal and breath sounds normal. No respiratory distress.  Musculoskeletal: Normal range of motion. She exhibits no edema.  Lymphadenopathy:       Head (right side): No submandibular adenopathy present.       Head (left side): No submandibular adenopathy present.  No submandibular adenopathy.  Neurological: She is alert and oriented to person, place, and time. No sensory deficit.  Skin: Skin is warm and dry.  Psychiatric: She has a normal mood and affect. Her behavior is normal.    ED Course  Procedures (including critical care time)  DIAGNOSTIC STUDIES: Oxygen Saturation is 99% on room air, normal by my interpretation.    COORDINATION OF CARE: 12:58  PM-Discussed treatment plan which includes pain medication and dental referral with pt at bedside and pt agreed to plan.    Labs Review Labs Reviewed - No data to display  Imaging Review No results found.  EKG Interpretation   None       MDM   1. Pain, dental     Dental pain without associated with dental infection. No evidence of dental abscess. Patient is afebrile, non toxic appearing and swallowing secretions well. I gave patient referral to dentist and stressed the importance of dental follow up for ultimate management of dental pain. I will give pain control, advised to withhold breast feeding if she needs to take medication. Refuses dental block. Patient voices understanding and is agreeable to plan.    I personally performed the services described in this documentation, which was scribed in my presence. The recorded information has been reviewed and is accurate.   Illene Labrador, PA-C 02/01/13 [phone]

## 2013-02-07 ENCOUNTER — Other Ambulatory Visit: Payer: Self-pay

## 2013-02-22 ENCOUNTER — Ambulatory Visit (INDEPENDENT_AMBULATORY_CARE_PROVIDER_SITE_OTHER): Payer: Medicaid Other | Admitting: Obstetrics & Gynecology

## 2013-02-22 VITALS — BP 139/76 | HR 72 | Temp 97.1°F | Ht 65.0 in | Wt 203.0 lb

## 2013-02-22 DIAGNOSIS — Z30431 Encounter for routine checking of intrauterine contraceptive device: Secondary | ICD-10-CM

## 2013-02-22 NOTE — Patient Instructions (Signed)
Levonorgestrel intrauterine device (IUD) What is this medicine? LEVONORGESTREL IUD (LEE voe nor jes trel) is a contraceptive (birth control) device. The device is placed inside the uterus by a healthcare professional. It is used to prevent pregnancy and can also be used to treat heavy bleeding that occurs during your period. Depending on the device, it can be used for 3 to 5 years. This medicine may be used for other purposes; ask your health care provider or pharmacist if you have questions. COMMON BRAND NAME(S): Mirena, Skyla What should I tell my health care provider before I take this medicine? They need to know if you have any of these conditions: -abnormal Pap smear -cancer of the breast, uterus, or cervix -diabetes -endometritis -genital or pelvic infection now or in the past -have more than one sexual partner or your partner has more than one partner -heart disease -history of an ectopic or tubal pregnancy -immune system problems -IUD in place -liver disease or tumor -problems with blood clots or take blood-thinners -use intravenous drugs -uterus of unusual shape -vaginal bleeding that has not been explained -an unusual or allergic reaction to levonorgestrel, other hormones, silicone, or polyethylene, medicines, foods, dyes, or preservatives -pregnant or trying to get pregnant -breast-feeding How should I use this medicine? This device is placed inside the uterus by a health care professional. Talk to your pediatrician regarding the use of this medicine in children. Special care may be needed. Overdosage: If you think you have taken too much of this medicine contact a poison control center or emergency room at once. NOTE: This medicine is only for you. Do not share this medicine with others. What if I miss a dose? This does not apply. What may interact with this medicine? Do not take this medicine with any of the following  medications: -amprenavir -bosentan -fosamprenavir This medicine may also interact with the following medications: -aprepitant -barbiturate medicines for inducing sleep or treating seizures -bexarotene -griseofulvin -medicines to treat seizures like carbamazepine, ethotoin, felbamate, oxcarbazepine, phenytoin, topiramate -modafinil -pioglitazone -rifabutin -rifampin -rifapentine -some medicines to treat HIV infection like atazanavir, indinavir, lopinavir, nelfinavir, tipranavir, ritonavir -St. John's wort -warfarin This list may not describe all possible interactions. Give your health care provider a list of all the medicines, herbs, non-prescription drugs, or dietary supplements you use. Also tell them if you smoke, drink alcohol, or use illegal drugs. Some items may interact with your medicine. What should I watch for while using this medicine? Visit your doctor or health care professional for regular check ups. See your doctor if you or your partner has sexual contact with others, becomes HIV positive, or gets a sexual transmitted disease. This product does not protect you against HIV infection (AIDS) or other sexually transmitted diseases. You can check the placement of the IUD yourself by reaching up to the top of your vagina with clean fingers to feel the threads. Do not pull on the threads. It is a good habit to check placement after each menstrual period. Call your doctor right away if you feel more of the IUD than just the threads or if you cannot feel the threads at all. The IUD may come out by itself. You may become pregnant if the device comes out. If you notice that the IUD has come out use a backup birth control method like condoms and call your health care provider. Using tampons will not change the position of the IUD and are okay to use during your period. What side effects may I   notice from receiving this medicine? Side effects that you should report to your doctor or  health care professional as soon as possible: -allergic reactions like skin rash, itching or hives, swelling of the face, lips, or tongue -fever, flu-like symptoms -genital sores -high blood pressure -no menstrual period for 6 weeks during use -pain, swelling, warmth in the leg -pelvic pain or tenderness -severe or sudden headache -signs of pregnancy -stomach cramping -sudden shortness of breath -trouble with balance, talking, or walking -unusual vaginal bleeding, discharge -yellowing of the eyes or skin Side effects that usually do not require medical attention (report to your doctor or health care professional if they continue or are bothersome): -acne -breast pain -change in sex drive or performance -changes in weight -cramping, dizziness, or faintness while the device is being inserted -headache -irregular menstrual bleeding within first 3 to 6 months of use -nausea This list may not describe all possible side effects. Call your doctor for medical advice about side effects. You may report side effects to FDA at 1-800-FDA-1088. Where should I keep my medicine? This does not apply. NOTE: This sheet is a summary. It may not cover all possible information. If you have questions about this medicine, talk to your doctor, pharmacist, or health care provider.  2014, Elsevier/Gold Standard. (2011-04-21 13:54:04)  

## 2013-03-04 NOTE — Progress Notes (Signed)
Patient ID: Virgel Gess, female   DOB: 12/28/1985, 27 y.o.   MRN: [id number] No LMP recorded. IUD Mirena placed 01/11/13, comes for string check. No complaints.  PE-  Vagina and cervix normal, string 2-3 cm. No discharge.   Imp: doing well after IUD placement, routine f/u  Woodroe Mode, MD

## 2013-05-15 ENCOUNTER — Emergency Department (HOSPITAL_COMMUNITY)
Admission: EM | Admit: 2013-05-15 | Discharge: 2013-05-15 | Disposition: A | Discharge status: Home / Self Care | Payer: Medicaid Other | Attending: Emergency Medicine | Admitting: Emergency Medicine

## 2013-05-15 ENCOUNTER — Encounter (HOSPITAL_COMMUNITY): Payer: Self-pay | Admitting: Emergency Medicine

## 2013-05-15 DIAGNOSIS — D649 Anemia, unspecified: Secondary | ICD-10-CM | POA: Insufficient documentation

## 2013-05-15 DIAGNOSIS — Z791 Long term (current) use of non-steroidal anti-inflammatories (NSAID): Secondary | ICD-10-CM | POA: Insufficient documentation

## 2013-05-15 DIAGNOSIS — Z87442 Personal history of urinary calculi: Secondary | ICD-10-CM | POA: Insufficient documentation

## 2013-05-15 DIAGNOSIS — N189 Chronic kidney disease, unspecified: Secondary | ICD-10-CM | POA: Insufficient documentation

## 2013-05-15 DIAGNOSIS — Z87891 Personal history of nicotine dependence: Secondary | ICD-10-CM | POA: Insufficient documentation

## 2013-05-15 DIAGNOSIS — M533 Sacrococcygeal disorders, not elsewhere classified: Secondary | ICD-10-CM | POA: Insufficient documentation

## 2013-05-15 DIAGNOSIS — H10409 Unspecified chronic conjunctivitis, unspecified eye: Secondary | ICD-10-CM

## 2013-05-15 DIAGNOSIS — Z87440 Personal history of urinary (tract) infections: Secondary | ICD-10-CM | POA: Insufficient documentation

## 2013-05-15 DIAGNOSIS — J45909 Unspecified asthma, uncomplicated: Secondary | ICD-10-CM | POA: Insufficient documentation

## 2013-05-15 DIAGNOSIS — Z79899 Other long term (current) drug therapy: Secondary | ICD-10-CM | POA: Insufficient documentation

## 2013-05-15 MED ORDER — BACITRACIN-POLYMYXIN B 500-10000 UNIT/GM OP OINT: 1.0000 "application " | TOPICAL_OINTMENT | Freq: Two times a day (BID) | OPHTHALMIC | Status: DC

## 2013-05-15 NOTE — ED Provider Notes (Signed)
Medical screening examination/treatment/procedure(s) were performed by non-physician practitioner and as supervising physician I was immediately available for consultation/collaboration.  EKG Interpretation   None         Alfonzo Feller, DO 05/15/13 1944

## 2013-05-15 NOTE — Discharge Instructions (Signed)
Take the prescribed medication as directed. Follow-up with your opthalamologist if begin experiencing trouble with your vision. Return to the ED for new or worsening symptoms.

## 2013-05-15 NOTE — ED Notes (Signed)
Pt c/o intermittent bilateral eye pain and drainage worse in left x 5 months that was worse over last couple of days

## 2013-05-15 NOTE — ED Provider Notes (Signed)
CSN: [id number]     Arrival date & time 05/15/13  1152 History  This chart was scribed for non-physician practitioner, Quincy Carnes, PA-C working with Alfonzo Feller, DO by Frederich Balding, ED scribe. This patient was seen in room TR04C/TR04C and the patient's care was started at 12:20 PM.    Chief Complaint  Patient presents with  . Eye Pain   The history is provided by the patient. No language interpreter was used.   HPI Comments: Jocelyn Garcia is a 28 y.o. female who presents to the Emergency Department complaining of intermittent bilateral eye irritation, left worse than right, with associated drainage that started 5 months ago. She states it has worsened over the last few days. Blinking her eye causes a film over her eye. She has used Visine drops with no relief. Denies visual disturbance. Pt wears glasses but does not wear contacts. No chemical or FB exposure to eye.  No trauma to the eye.  Pt is also complaining of worsening tailbone pain after having her last baby 5 months ago. She was seen at her post-partum visit and told this was normal following childbirth but is concerned because it does not seem to be imrpoving. Denies injury, trauma, or falls. Sitting on her tailbone worsens the pain. Denies numbness or paresthesias of LE.  No loss of bowel or bladder control.  VS stable on arrival.  Past Medical History  Diagnosis Date  . Anemia   . Asthma     exercise induced  . Urinary tract infection   . Chronic kidney disease   . Kidney stones    Past Surgical History  Procedure Laterality Date  . Wisdom tooth extraction     Family History  Problem Relation Age of Onset  . Anesthesia problems Neg Hx   . Heart disease Mother   . Hypertension Mother   . Diabetes Mother   . Heart disease Maternal Grandmother   . Stroke Maternal Grandmother   . Hypertension Maternal Grandmother   . Cancer Maternal Grandfather    History  Substance Use Topics  . Smoking status: Former  Smoker    Quit date: 03/09/2011  . Smokeless tobacco: Never Used  . Alcohol Use: No   OB History   Grav Para Term Preterm Abortions TAB SAB Ect Mult Living   2 2 2       2      Review of Systems  Eyes: Positive for pain and discharge. Negative for visual disturbance.  All other systems reviewed and are negative.   Allergies  Review of patient's allergies indicates no known allergies.  Home Medications   Current Outpatient Rx  Name  Route  Sig  Dispense  Refill  . albuterol (PROVENTIL HFA;VENTOLIN HFA) 108 (90 BASE) MCG/ACT inhaler   Inhalation   Inhale 2 puffs into the lungs every 6 (six) hours as needed. For shortness of breath   1 Inhaler   3   . ibuprofen (ADVIL,MOTRIN) 600 MG tablet   Oral   Take 1 tablet (600 mg total) by mouth every 6 (six) hours.   90 tablet   1   . IRON PO   Oral   Take 1 tablet by mouth 2 (two) times daily.          Marland Kitchen oxyCODONE-acetaminophen (PERCOCET) 5-325 MG per tablet   Oral   Take 1-2 tablets by mouth every 6 (six) hours as needed for pain.   10 tablet   0   . Prenatal  Vit-Fe Fumarate-FA (PRENATAL MULTIVITAMIN) TABS tablet   Oral   Take 1 tablet by mouth daily at 12 noon.          BP 144/92  Pulse 81  Temp(Src) 97.4 F (36.3 C)  Resp 18  Wt 200 lb (90.719 kg)  SpO2 98%  Physical Exam  Nursing note and vitals reviewed. Constitutional: She is oriented to person, place, and time. She appears well-developed and well-nourished. No distress.  HENT:  Head: Normocephalic and atraumatic.  Mouth/Throat: Oropharynx is clear and moist.  Eyes: Conjunctivae and EOM are normal. Pupils are equal, round, and reactive to light.  Left conjunctiva appears normal. No visible drainage. Some crusting along lower lid margin. EOM intact and nonpainful.   Neck: Normal range of motion. Neck supple.  Cardiovascular: Normal rate, regular rhythm and normal heart sounds.   Pulmonary/Chest: Effort normal and breath sounds normal. No respiratory  distress. She has no wheezes.  Musculoskeletal: Normal range of motion. She exhibits no edema.  Mild TTP with palpation to coccyx/sacrum without noted deformity; norma ROM; sitting upright in chair without pain; ambulating unassisted without difficulty  Neurological: She is alert and oriented to person, place, and time.  Skin: Skin is warm and dry. She is not diaphoretic.  Psychiatric: She has a normal mood and affect.   ED Course  Procedures (including critical care time)  DIAGNOSTIC STUDIES: Oxygen Saturation is 98% on RA, normal by my interpretation.    COORDINATION OF CARE: 12:28 PM-Discussed treatment plan which includes an antibiotic with pt at bedside and pt agreed to plan. Advised pt to follow up with her eye doctor if symptoms worsen or do not resolve.   Labs Review Labs Reviewed - No data to display Imaging Review No results found.  EKG Interpretation   None       MDM   Final diagnoses:  Chronic conjunctivitis   Patient with chronic conjunctivitis, no current visual disturbance. 5 months of chronic tailbone pain following the birth of her daughter. No recent injuries, trauma, or falls-- no deformities noted on exam today.  No signs/sx concerning for cauda equina. Patient does not have a primary care physician at this time.  Will start her on polymyxin ointment for conjunctivitis.  She will establish care with a new PCP to manage pain-- referral and resource guide provided.  FU with eye doctor if begin experiencing trouble with her vision.  Discussed plan with pt, she acknowledged understanding and agreed with plan of care.  Return precautions advised.  I personally performed the services described in this documentation, which was scribed in my presence. The recorded information has been reviewed and is accurate.  Larene Pickett, PA-C 05/15/13 1438

## 2013-08-15 ENCOUNTER — Emergency Department (HOSPITAL_COMMUNITY)
Admission: EM | Admit: 2013-08-15 | Discharge: 2013-08-15 | Disposition: A | Discharge status: Home / Self Care | Payer: Medicaid Other | Attending: Emergency Medicine | Admitting: Emergency Medicine

## 2013-08-15 ENCOUNTER — Encounter (HOSPITAL_COMMUNITY): Payer: Self-pay | Admitting: Emergency Medicine

## 2013-08-15 DIAGNOSIS — D649 Anemia, unspecified: Secondary | ICD-10-CM | POA: Insufficient documentation

## 2013-08-15 DIAGNOSIS — N189 Chronic kidney disease, unspecified: Secondary | ICD-10-CM | POA: Insufficient documentation

## 2013-08-15 DIAGNOSIS — Z87442 Personal history of urinary calculi: Secondary | ICD-10-CM | POA: Insufficient documentation

## 2013-08-15 DIAGNOSIS — J45909 Unspecified asthma, uncomplicated: Secondary | ICD-10-CM | POA: Insufficient documentation

## 2013-08-15 DIAGNOSIS — Z79899 Other long term (current) drug therapy: Secondary | ICD-10-CM | POA: Insufficient documentation

## 2013-08-15 DIAGNOSIS — H109 Unspecified conjunctivitis: Secondary | ICD-10-CM

## 2013-08-15 DIAGNOSIS — Z87440 Personal history of urinary (tract) infections: Secondary | ICD-10-CM | POA: Insufficient documentation

## 2013-08-15 DIAGNOSIS — F17200 Nicotine dependence, unspecified, uncomplicated: Secondary | ICD-10-CM | POA: Insufficient documentation

## 2013-08-15 MED ORDER — ERYTHROMYCIN 5 MG/GM OP OINT: TOPICAL_OINTMENT | OPHTHALMIC | Status: DC

## 2013-08-15 NOTE — Discharge Instructions (Signed)
Use ointment as directed until symptoms resolve. Refer to attached documents for more information.

## 2013-08-15 NOTE — ED Notes (Signed)
Having drainage from  Left eye x 1 week

## 2013-08-15 NOTE — ED Provider Notes (Signed)
CSN: [id number]     Arrival date & time 08/15/13  1120 History  This chart was scribed for non-physician practitioner, Alvina Chou, PA-C working with Blanchard Kelch, MD by Frederich Balding, ED scribe. This patient was seen in room TR04C/TR04C and the patient's care was started at 12:27 PM.   Chief Complaint  Patient presents with  . Eye Pain   The history is provided by the patient. No language interpreter was used.   HPI Comments: Jocelyn Garcia is a 28 y.o. female who presents to the Emergency Department complaining of intermittent left eye discharge that started 8 months ago. States it has resolved some over the last week. She has been evaluated for the same in the past and given polysporin ophthalmic ointment with temporary relief. Denies getting any foreign body in her eye. Denies fever.   Past Medical History  Diagnosis Date  . Anemia   . Asthma     exercise induced  . Urinary tract infection   . Chronic kidney disease   . Kidney stones    Past Surgical History  Procedure Laterality Date  . Wisdom tooth extraction     Family History  Problem Relation Age of Onset  . Anesthesia problems Neg Hx   . Heart disease Mother   . Hypertension Mother   . Diabetes Mother   . Heart disease Maternal Grandmother   . Stroke Maternal Grandmother   . Hypertension Maternal Grandmother   . Cancer Maternal Grandfather    History  Substance Use Topics  . Smoking status: Current Every Day Smoker    Last Attempt to Quit: 03/09/2011  . Smokeless tobacco: Never Used  . Alcohol Use: No   OB History   Grav Para Term Preterm Abortions TAB SAB Ect Mult Living   2 2 2       2      Review of Systems  Constitutional: Negative for fever.  Eyes: Positive for discharge.  All other systems reviewed and are negative.  Allergies  Review of patient's allergies indicates no known allergies.  Home Medications   Prior to Admission medications   Medication Sig Start Date End Date  Taking? Authorizing Provider  albuterol (PROVENTIL HFA;VENTOLIN HFA) 108 (90 BASE) MCG/ACT inhaler Inhale 2 puffs into the lungs every 6 (six) hours as needed. For shortness of breath 10/24/12   Joelyn Oms, CNM  bacitracin-polymyxin b (POLYSPORIN) ophthalmic ointment Place 1 application into the left eye every 12 (twelve) hours. apply to eye every 12 hours while awake 05/15/13   Larene Pickett, PA-C  ibuprofen (ADVIL,MOTRIN) 600 MG tablet Take 1 tablet (600 mg total) by mouth every 6 (six) hours. 12/10/12   Allen Norris, MD  IRON PO Take 1 tablet by mouth 2 (two) times daily.     Historical Provider, MD  oxyCODONE-acetaminophen (PERCOCET) 5-325 MG per tablet Take 1-2 tablets by mouth every 6 (six) hours as needed for pain. 02/01/13   Illene Labrador, PA-C  Prenatal Vit-Fe Fumarate-FA (PRENATAL MULTIVITAMIN) TABS tablet Take 1 tablet by mouth daily at 12 noon.    Historical Provider, MD   BP 139/97  Pulse 58  Temp(Src) 98.3 F (36.8 C)  Resp 16  SpO2 98%  Physical Exam  Nursing note and vitals reviewed. Constitutional: She is oriented to person, place, and time. She appears well-developed and well-nourished. No distress.  HENT:  Head: Normocephalic and atraumatic.  Eyes: EOM are normal.  Left conjunctival injection and water discharge. Mild photophobia.  Neck: Neck supple. No tracheal deviation present.  Cardiovascular: Normal rate.   Pulmonary/Chest: Effort normal. No respiratory distress.  Musculoskeletal: Normal range of motion.  Neurological: She is alert and oriented to person, place, and time.  Skin: Skin is warm and dry.  Psychiatric: She has a normal mood and affect. Her behavior is normal.    ED Course  Procedures (including critical care time)  DIAGNOSTIC STUDIES: Oxygen Saturation is 98% on RA, normal by my interpretation.    COORDINATION OF CARE: 12:29 PM-Discussed treatment plan which includes antibiotic eye drop with pt at bedside and pt agreed to plan.    Labs Review Labs Reviewed - No data to display  Imaging Review No results found.   EKG Interpretation None      MDM   Final diagnoses:  Conjunctivitis of left eye    Patient likely has conjunctivitis vs corneal abrasion of the left eye. Patient will have erythromycin ointment for her symptoms. Vitals stable and patient afebrile.   I personally performed the services described in this documentation, which was scribed in my presence. The recorded information has been reviewed and is accurate.  Alvina Chou, [place] 08/16/13 [phone]

## 2013-08-16 NOTE — ED Provider Notes (Signed)
Medical screening examination/treatment/procedure(s) were performed by non-physician practitioner and as supervising physician I was immediately available for consultation/collaboration.   EKG Interpretation None        Blanchard Kelch, MD 08/16/13 [phone]

## 2013-09-06 IMAGING — US US RENAL
1 series · 13 of 25 positions shown · non-contrast
Comparison: CT 6117

CLINICAL DATA: Hematuria.  34 weeks estimated gestational age

RENAL/URINARY TRACT ULTRASOUND COMPLETE

[Series 1: us renal · 33 acquisitions, 13 frames shown]
[im 1/33]
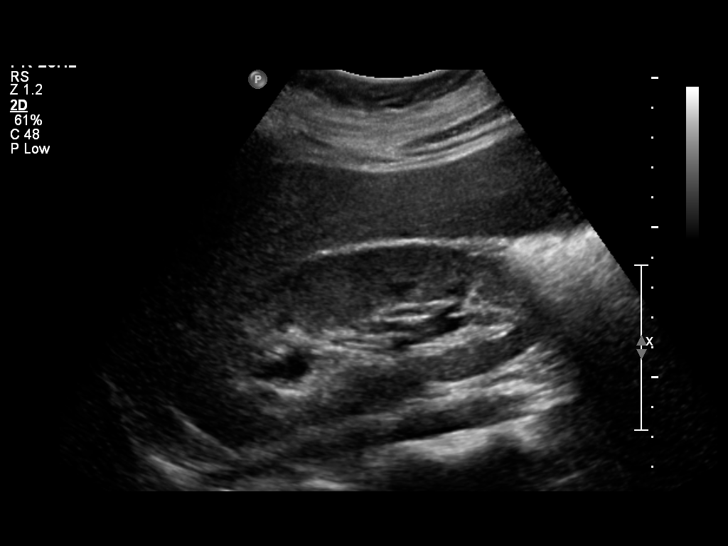
[im 3/33]
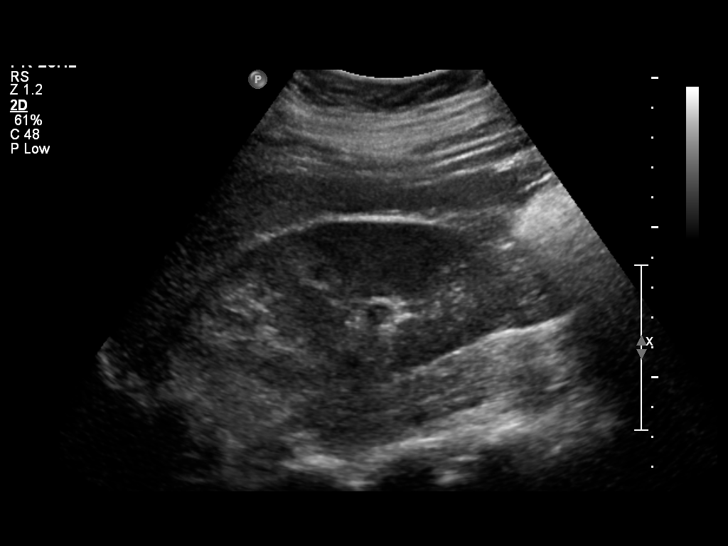
[im 6/33]
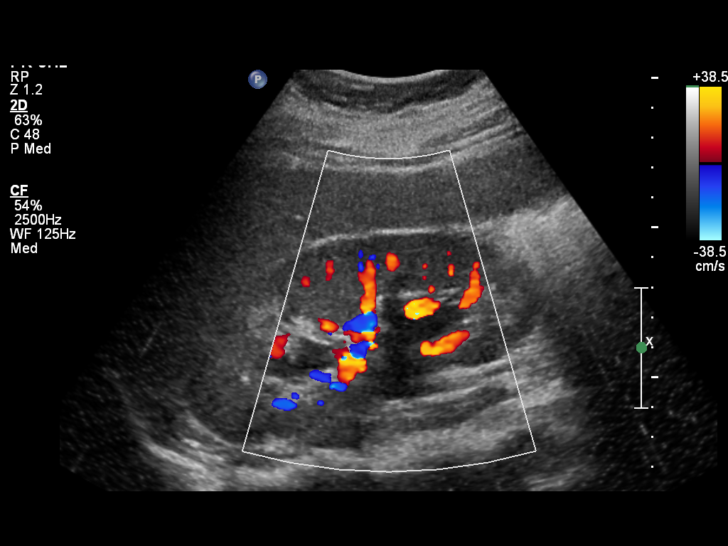
[im 9/33]
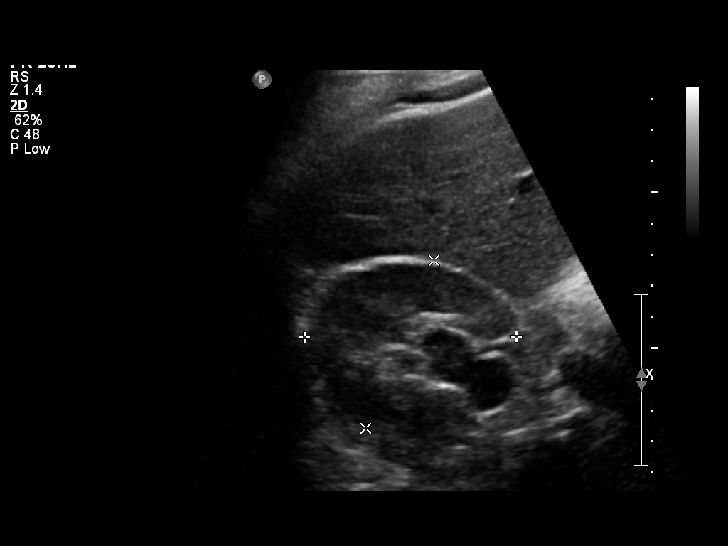
[im 11/33]
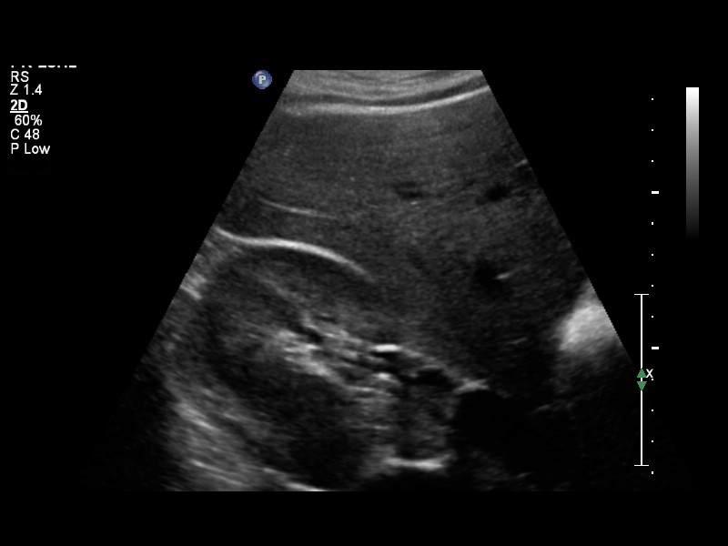
[im 14/33]
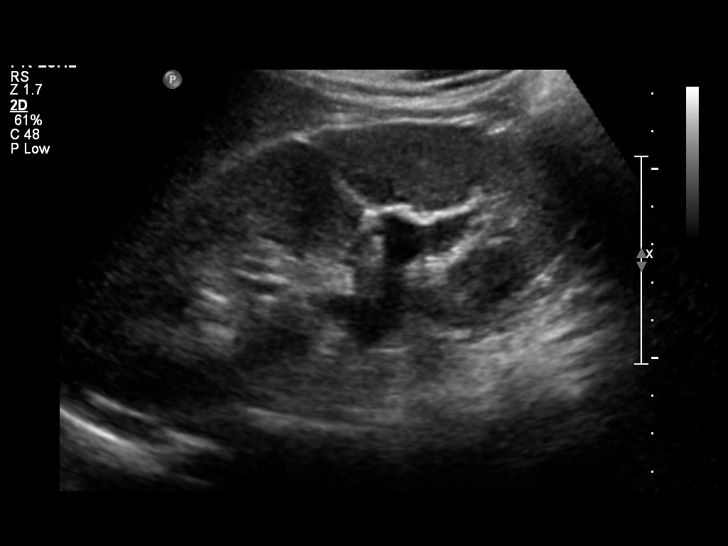
[im 17/33]
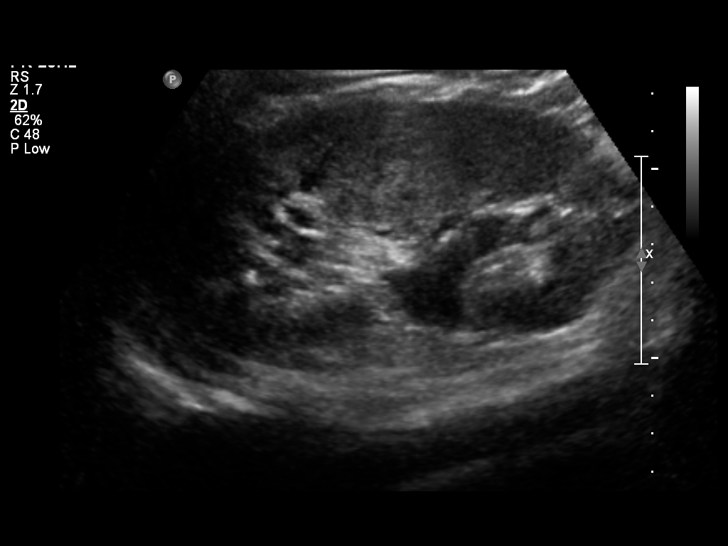
[im 19/33]
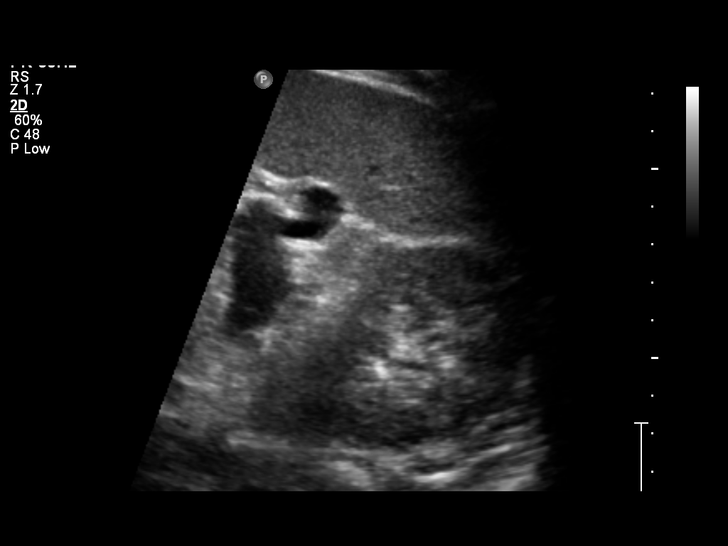
[im 22/33]
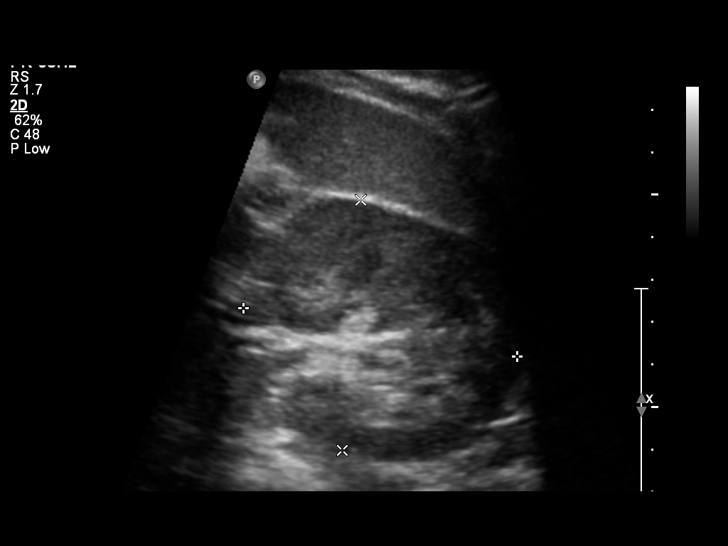
[im 25/33]
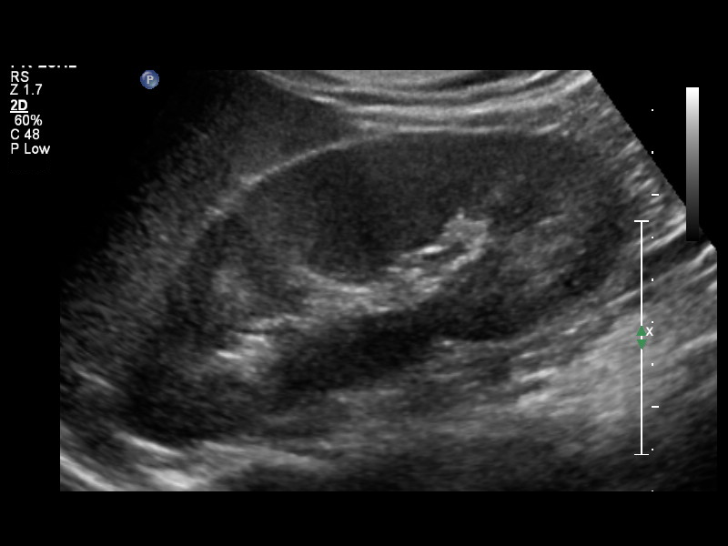
[im 27/33]
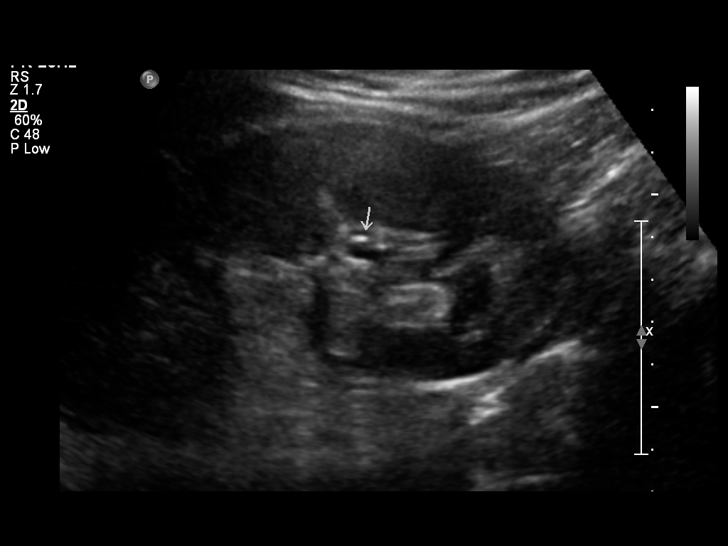
[im 30/33]
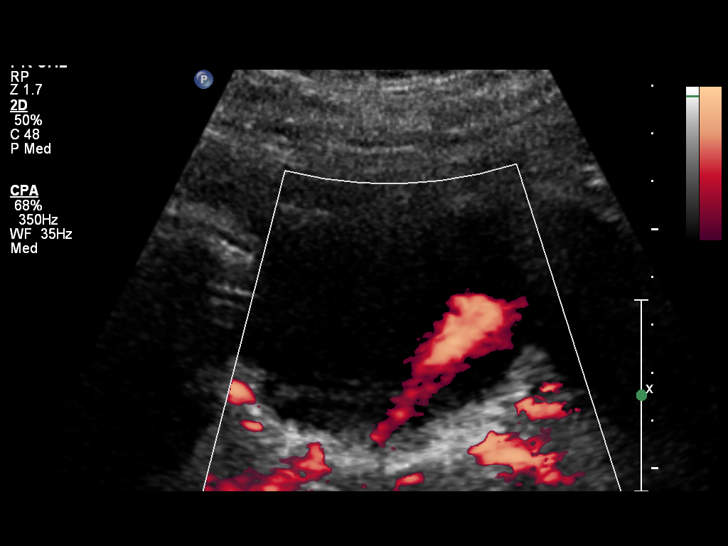
[im 33/33]
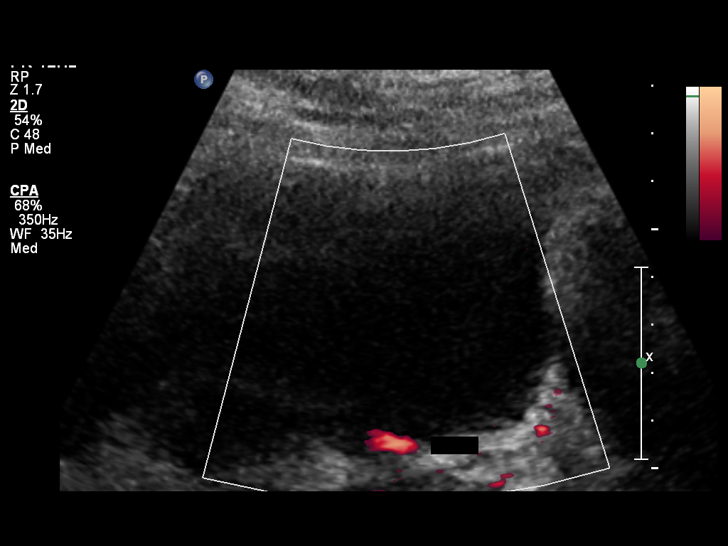

[13 of 25 positions shown; findings below may reference images not displayed]

FINDINGS: Right Kidney:  Demonstrates a sagittal length of 12.4 cm.  There is
moderate pyelectasis with no associated caliectasis or
ureterectasis.  No definite renal calculi or focal parenchymal
abnormalities are seen.  The degree of pyelectasis is felt to
correlate with current gravid state.

Left Kidney:  Demonstrates a sagittal length of 14.4 cm.  There is
moderate pyelectasis identified with no associated caliectasis or
ureterectasis.  The degree of pyelectasis would correlate with the
current gravid status.  A small echogenic focus is seen in the mid
to lower pole measuring 3 mm.  This demonstrates no posterior
acoustical shadowing but does show a total effect with color
Doppler exam and is suspicious for a small renal calculus.  No
other focal parenchymal abnormalities are seen.

Bladder:  Bilateral ureteral jets are noted.  No focal bladder
abnormality is suggested.
IMPRESSION: Moderate renal pyelectasis bilaterally with no associated
caliectasis or ureterectasis.  This finding is felt to correlate
with the current gestational age.

Probable small nonobstructing left renal calculus

## 2014-01-17 ENCOUNTER — Emergency Department (HOSPITAL_COMMUNITY)
Admission: EM | Admit: 2014-01-17 | Discharge: 2014-01-17 | Disposition: A | Discharge status: Home / Self Care | Payer: Medicaid Other | Attending: Emergency Medicine | Admitting: Emergency Medicine

## 2014-01-17 ENCOUNTER — Encounter (HOSPITAL_COMMUNITY): Payer: Self-pay | Admitting: Emergency Medicine

## 2014-01-17 DIAGNOSIS — Z79899 Other long term (current) drug therapy: Secondary | ICD-10-CM | POA: Insufficient documentation

## 2014-01-17 DIAGNOSIS — Z87442 Personal history of urinary calculi: Secondary | ICD-10-CM | POA: Diagnosis not present

## 2014-01-17 DIAGNOSIS — D649 Anemia, unspecified: Secondary | ICD-10-CM | POA: Insufficient documentation

## 2014-01-17 DIAGNOSIS — N189 Chronic kidney disease, unspecified: Secondary | ICD-10-CM | POA: Insufficient documentation

## 2014-01-17 DIAGNOSIS — Z792 Long term (current) use of antibiotics: Secondary | ICD-10-CM | POA: Diagnosis not present

## 2014-01-17 DIAGNOSIS — Z87440 Personal history of urinary (tract) infections: Secondary | ICD-10-CM | POA: Diagnosis not present

## 2014-01-17 DIAGNOSIS — K088 Other specified disorders of teeth and supporting structures: Secondary | ICD-10-CM | POA: Insufficient documentation

## 2014-01-17 DIAGNOSIS — J45909 Unspecified asthma, uncomplicated: Secondary | ICD-10-CM | POA: Insufficient documentation

## 2014-01-17 DIAGNOSIS — K0889 Other specified disorders of teeth and supporting structures: Secondary | ICD-10-CM

## 2014-01-17 DIAGNOSIS — Z720 Tobacco use: Secondary | ICD-10-CM | POA: Diagnosis not present

## 2014-01-17 MED ORDER — PENICILLIN V POTASSIUM 500 MG PO TABS: 500.0000 mg | ORAL_TABLET | Freq: Four times a day (QID) | ORAL | Status: DC

## 2014-01-17 MED ORDER — OXYCODONE-ACETAMINOPHEN 5-325 MG PO TABS: 1.0000 | ORAL_TABLET | ORAL | Status: DC | PRN

## 2014-01-17 MED ORDER — OXYCODONE-ACETAMINOPHEN 5-325 MG PO TABS
2.0000 | ORAL_TABLET | Freq: Once | ORAL | Status: AC
  Administered 2014-01-17: 2 via ORAL
  Filled 2014-01-17: qty 2

## 2014-01-17 MED ORDER — ONDANSETRON 4 MG PO TBDP
8.0000 mg | ORAL_TABLET | Freq: Once | ORAL | Status: AC
  Administered 2014-01-17: 8 mg via ORAL
  Filled 2014-01-17: qty 2

## 2014-01-17 NOTE — ED Provider Notes (Signed)
CSN: [id number]     Arrival date & time 01/17/14  1355 History  This chart was scribed for non-physician practitioner, Cleatrice Burke, PA-C,working with Francine Graven, DO, by Marlowe Kays, ED Scribe. This patient was seen in room TR06C/TR06C and the patient's care was started at 2:43 PM.  Chief Complaint  Patient presents with  . Dental Pain   Patient is a 28 y.o. female presenting with tooth pain. The history is provided by the patient. No language interpreter was used.  Dental Pain Associated symptoms: no fever    HPI Comments:  Jocelyn Garcia is a 28 y.o. female who presents to the Emergency Department complaining of a broken tooth with severe throbbing pain for the past four days. Pt states she broke a tooth on the upper right side 2 weeks ago and the pain has since intensified. She reports using Orajel and taking Aleve with no significant relief of the pain. She states cold liquids and cold air make the pain worse. She denies any alleviating factors. Denies fever, chills, nausea or vomiting.  Past Medical History  Diagnosis Date  . Anemia   . Asthma     exercise induced  . Urinary tract infection   . Chronic kidney disease   . Kidney stones    Past Surgical History  Procedure Laterality Date  . Wisdom tooth extraction     Family History  Problem Relation Age of Onset  . Anesthesia problems Neg Hx   . Heart disease Mother   . Hypertension Mother   . Diabetes Mother   . Heart disease Maternal Grandmother   . Stroke Maternal Grandmother   . Hypertension Maternal Grandmother   . Cancer Maternal Grandfather    History  Substance Use Topics  . Smoking status: Current Every Day Smoker    Last Attempt to Quit: 03/09/2011  . Smokeless tobacco: Never Used  . Alcohol Use: No   OB History   Grav Para Term Preterm Abortions TAB SAB Ect Mult Living   2 2 2       2      Review of Systems  Constitutional: Negative for fever and chills.  HENT: Positive for dental  problem.   Gastrointestinal: Negative for nausea and vomiting.  All other systems reviewed and are negative.   Allergies  Review of patient's allergies indicates no known allergies.  Home Medications   Prior to Admission medications   Medication Sig Start Date End Date Taking? Authorizing Provider  albuterol (PROVENTIL HFA;VENTOLIN HFA) 108 (90 BASE) MCG/ACT inhaler Inhale 2 puffs into the lungs every 6 (six) hours as needed. For shortness of breath 10/24/12   Gwen Pounds, CNM  bacitracin-polymyxin b (POLYSPORIN) ophthalmic ointment Place 1 application into the left eye every 12 (twelve) hours. apply to eye every 12 hours while awake 05/15/13   Larene Pickett, PA-C  erythromycin ophthalmic ointment Place a 1/2 inch ribbon of ointment into the lower eyelid. 08/15/13   Kaitlyn Szekalski, PA-C  IRON PO Take 1 tablet by mouth daily.     Historical Provider, MD   Triage Vitals: BP 148/84  Pulse 90  Temp(Src) 98.5 F (36.9 C) (Oral)  Resp 18  SpO2 100% Physical Exam  Nursing note and vitals reviewed. Constitutional: She is oriented to person, place, and time. She appears well-developed and well-nourished. No distress.  HENT:  Head: Normocephalic and atraumatic.  Right Ear: External ear normal.  Left Ear: External ear normal.  Nose: Nose normal.  Mouth/Throat: Oropharynx is clear  and moist. No trismus in the jaw.    No submental edema or tongue elevation.  Eyes: Conjunctivae are normal.  Neck: Normal range of motion.  Cardiovascular: Normal rate, regular rhythm and normal heart sounds.   Pulmonary/Chest: Effort normal and breath sounds normal. No stridor. No respiratory distress. She has no wheezes. She has no rales.  Abdominal: Soft. She exhibits no distension.  Musculoskeletal: Normal range of motion.  Neurological: She is alert and oriented to person, place, and time. She has normal strength.  Skin: Skin is warm and dry. She is not diaphoretic. No erythema.  Psychiatric: She  has a normal mood and affect. Her behavior is normal.    ED Course  Procedures (including critical care time) DIAGNOSTIC STUDIES: Oxygen Saturation is 100% on RA, normal by my interpretation.   COORDINATION OF CARE: 2:45 PM- Will prescribe pain medication and antibiotic. Will refer to dentist. Pt verbalizes understanding and agrees to plan.  Medications - No data to display  Labs Review Labs Reviewed - No data to display  Imaging Review No results found.   EKG Interpretation None      MDM   Final diagnoses:  Pain, dental   Patient with toothache.  No gross abscess.  Exam unconcerning for Ludwig's angina or spread of infection.  Will treat with penicillin and pain medicine.  Urged patient to follow-up with dentist.     I personally performed the services described in this documentation, which was scribed in my presence. The recorded information has been reviewed and is accurate.    Elwyn Lade, PA-C 01/17/14 [phone]

## 2014-01-17 NOTE — Discharge Instructions (Signed)
Dental Pain A tooth ache may be caused by cavities (tooth decay). Cavities expose the nerve of the tooth to air and hot or cold temperatures. It may come from an infection or abscess (also called a boil or furuncle) around your tooth. It is also often caused by dental caries (tooth decay). This causes the pain you are having. DIAGNOSIS  Your caregiver can diagnose this problem by exam. TREATMENT   If caused by an infection, it may be treated with medications which kill germs (antibiotics) and pain medications as prescribed by your caregiver. Take medications as directed.  Only take over-the-counter or prescription medicines for pain, discomfort, or fever as directed by your caregiver.  Whether the tooth ache today is caused by infection or dental disease, you should see your dentist as soon as possible for further care. SEEK MEDICAL CARE IF: The exam and treatment you received today has been provided on an emergency basis only. This is not a substitute for complete medical or dental care. If your problem worsens or new problems (symptoms) appear, and you are unable to meet with your dentist, call or return to this location. SEEK IMMEDIATE MEDICAL CARE IF:   You have a fever.  You develop redness and swelling of your face, jaw, or neck.  You are unable to open your mouth.  You have severe pain uncontrolled by pain medicine. MAKE SURE YOU:   Understand these instructions.  Will watch your condition.  Will get help right away if you are not doing well or get worse. Document Released: 03/21/2005 Document Revised: 06/13/2011 Document Reviewed: 11/07/2007 [facility] Patient Information 2015 [place], [place]. This information is not intended to replace advice given to you by your health care provider. Make sure you discuss any questions you have with your health care provider. Return to the emergency room for fever, change in vision, redness to the face that rapidly spreads towards the eye,  nausea or vomiting, difficulty swallowing or shortness of breath.  You may follow with the dentist listed above. Alternatively,  you may also contact [facility]on [facility] and Ladell Pier who run a low-cost dental clinic at their phone number is [phone]. You may also call [phone]  Dental Assistance If the dentist on-call cannot see you, please use the resources below:   Patients with Medicaid: [place] [phone] [address], [place] [address] [phone]  If unable to pay, or uninsured, contact [facility] [phone]) or [place] [phone] in [place], [place] in [facility]) to become qualified for the adult dental clinic  Other [place], [place], [place], [address]    [phone], Ext. [phone]    2nd and 4th Thursday of the month at 6:30am    10 clients each day by appointment, can sometimes see walk-in     patients if someone does not show for an appointment [place], [place], [place], [address]    [phone] [facility]- [address], [place], [place], [address]    [phone]  [place] Department- [phone] [place] [facility] Department- [phone]

## 2014-01-17 NOTE — ED Notes (Signed)
Pt c/o right upper dental pain. States tooth broke-off 1 week ago and has been painful last 4 days. Slight swelling noted.

## 2014-01-17 NOTE — ED Notes (Signed)
Pt c/o right upper dental pain from broken tooth

## 2014-01-17 NOTE — ED Provider Notes (Signed)
Medical screening examination/treatment/procedure(s) were performed by non-physician practitioner and as supervising physician I was immediately available for consultation/collaboration.   EKG Interpretation None        Francine Graven, DO 01/17/14 2023

## 2014-02-03 ENCOUNTER — Encounter (HOSPITAL_COMMUNITY): Payer: Self-pay | Admitting: Emergency Medicine

## 2014-02-16 ENCOUNTER — Encounter (HOSPITAL_COMMUNITY): Payer: Self-pay

## 2014-02-16 ENCOUNTER — Inpatient Hospital Stay (HOSPITAL_COMMUNITY)
Admission: AD | Admit: 2014-02-16 | Discharge: 2014-02-16 | Disposition: A | Discharge status: Home / Self Care | Payer: Medicaid Other | Source: Ambulatory Visit | Attending: Obstetrics & Gynecology | Admitting: Obstetrics & Gynecology

## 2014-02-16 DIAGNOSIS — T8383XA Hemorrhage of genitourinary prosthetic devices, implants and grafts, initial encounter: Secondary | ICD-10-CM | POA: Insufficient documentation

## 2014-02-16 DIAGNOSIS — Z975 Presence of (intrauterine) contraceptive device: Secondary | ICD-10-CM | POA: Diagnosis not present

## 2014-02-16 DIAGNOSIS — R102 Pelvic and perineal pain: Secondary | ICD-10-CM

## 2014-02-16 LAB — URINALYSIS, ROUTINE W REFLEX MICROSCOPIC
Bilirubin Urine: NEGATIVE
Glucose, UA: NEGATIVE mg/dL
KETONES UR: NEGATIVE mg/dL
LEUKOCYTES UA: NEGATIVE
NITRITE: NEGATIVE
PH: 6.5 (ref 5.0–8.0)
PROTEIN: NEGATIVE mg/dL
Specific Gravity, Urine: 1.01 (ref 1.005–1.030)
UROBILINOGEN UA: 0.2 mg/dL (ref 0.0–1.0)

## 2014-02-16 LAB — URINE MICROSCOPIC-ADD ON

## 2014-02-16 LAB — POCT PREGNANCY, URINE: Preg Test, Ur: NEGATIVE

## 2014-02-16 NOTE — MAU Note (Signed)
Pt presents complaining of abdominal cramping for several days. States she thinks her period is about to start but it hasn't and is late for her period. Pt has Mirena IUD. States she also feels funny. Pt states she is having some spotting. Denies other vaginal discharge. Negative home pregnancy test.

## 2014-02-16 NOTE — MAU Provider Note (Signed)
History     CSN: [id number]  Arrival date and time: 02/16/14 1200   First Provider Initiated Contact with Patient 02/16/14 1300      Chief Complaint  Patient presents with  . Abdominal Cramping   HPI  Jocelyn Garcia is a 28 y.o. [facility] pt. She had Mirena placed 10/14 post partum, LMP end of Sept.  She has had low abd cramping, L>R, with spottting x 2wks. It is cramping like you're going to start your period, but she hasn't. No change in discharge, odor or itching. She has urinary urgency with small amts this week, no frequency or dysuria. She has frequent constipation from taking iron- takes colace and drinks lots of water to manage. Same partner x 4 yr. She has been moody, nauseated in the am's- is afraid has an ectopic pregnancy.  OB History    Gravida Para Term Preterm AB TAB SAB Ectopic Multiple Living   2 2 2       2       Past Medical History  Diagnosis Date  . Anemia   . Asthma     exercise induced  . Urinary tract infection   . Chronic kidney disease   . Kidney stones     Past Surgical History  Procedure Laterality Date  . Wisdom tooth extraction      Family History  Problem Relation Age of Onset  . Anesthesia problems Neg Hx   . Heart disease Mother   . Hypertension Mother   . Diabetes Mother   . Heart disease Maternal Grandmother   . Stroke Maternal Grandmother   . Hypertension Maternal Grandmother   . Cancer Maternal Grandfather     History  Substance Use Topics  . Smoking status: Current Every Day Smoker    Last Attempt to Quit: 03/09/2011  . Smokeless tobacco: Never Used  . Alcohol Use: No    Allergies: No Known Allergies  Prescriptions prior to admission  Medication Sig Dispense Refill Last Dose  . albuterol (PROVENTIL HFA;VENTOLIN HFA) 108 (90 BASE) MCG/ACT inhaler Inhale 2 puffs into the lungs every 6 (six) hours as needed. For shortness of breath 1 Inhaler 3 unk  . bacitracin-polymyxin b (POLYSPORIN) ophthalmic ointment  Place 1 application into the left eye every 12 (twelve) hours. apply to eye every 12 hours while awake 3.5 g 0 08/14/2013 at Unknown time  . erythromycin ophthalmic ointment Place a 1/2 inch ribbon of ointment into the lower eyelid. 1 g 0   . IRON PO Take 1 tablet by mouth daily.    08/15/2013 at Unknown time  . oxyCODONE-acetaminophen (PERCOCET/ROXICET) 5-325 MG per tablet Take 1 tablet by mouth every 4 (four) hours as needed for severe pain. May take 2 tablets PO q 6 hours for severe pain - Do not take with Tylenol as this tablet already contains tylenol 15 tablet 0   . penicillin v potassium (VEETID) 500 MG tablet Take 1 tablet (500 mg total) by mouth 4 (four) times daily. 40 tablet 0     Review of Systems  Constitutional: Negative for fever and chills.  Gastrointestinal: Positive for nausea, abdominal pain and constipation. Negative for heartburn, vomiting and diarrhea.  Genitourinary: Positive for urgency. Negative for dysuria, frequency and hematuria.  Neurological: Negative for weakness.   Physical Exam   Blood pressure 132/73, pulse 75, temperature 98 F (36.7 C), temperature source Oral, resp. rate 18, last menstrual period 12/29/2013, currently breastfeeding.  Physical Exam  Nursing note and vitals  reviewed. Constitutional: She is oriented to person, place, and time. She appears well-developed and well-nourished.  GI: Soft. She exhibits no distension. There is no tenderness. There is no rebound and no guarding.  Genitourinary:  Pelvic exam- Ext gen- nl anatomy, skin intact Vagina- small amt white mucoid discharge Cx- parous, eversion, 2 dark strings through os, length appears appropriate Uterus- nl size, non tender Adn- no masses palp, sl tender LLQ  Musculoskeletal: Normal range of motion.  Neurological: She is alert and oriented to person, place, and time.  Skin: Skin is warm and dry.  Psychiatric: She has a normal mood and affect. Her behavior is normal.    MAU Course   Procedures  MDM Results for orders placed or performed during the hospital encounter of 02/16/14 (from the past 24 hour(s))  Urinalysis, Routine w reflex microscopic     Status: Abnormal   Collection Time: 02/16/14 12:20 PM  Result Value Ref Range   Color, Urine YELLOW YELLOW   APPearance CLEAR CLEAR   Specific Gravity, Urine 1.010 1.005 - 1.030   pH 6.5 5.0 - 8.0   Glucose, UA NEGATIVE NEGATIVE mg/dL   Hgb urine dipstick SMALL (A) NEGATIVE   Bilirubin Urine NEGATIVE NEGATIVE   Ketones, ur NEGATIVE NEGATIVE mg/dL   Protein, ur NEGATIVE NEGATIVE mg/dL   Urobilinogen, UA 0.2 0.0 - 1.0 mg/dL   Nitrite NEGATIVE NEGATIVE   Leukocytes, UA NEGATIVE NEGATIVE  Urine microscopic-add on     Status: None   Collection Time: 02/16/14 12:20 PM  Result Value Ref Range   Squamous Epithelial / LPF RARE RARE   RBC / HPF 0-2 <3 RBC/hpf  Pregnancy, urine POC     Status: None   Collection Time: 02/16/14 12:49 PM  Result Value Ref Range   Preg Test, Ur NEGATIVE NEGATIVE     Assessment and Plan  Cramping and spotting with Mirena, exam essentially normal. Will schedule out pt pelvic U/S Strongly encouraged her to schedule annual welll woman exam  Sonna Lipsky M. 02/16/2014, 1:05 PM

## 2014-02-21 ENCOUNTER — Ambulatory Visit (HOSPITAL_COMMUNITY)
Admission: RE | Admit: 2014-02-21 | Discharge: 2014-02-21 | Disposition: A | Discharge status: Home / Self Care | Payer: Medicaid Other | Source: Ambulatory Visit | Attending: Obstetrics and Gynecology | Admitting: Obstetrics and Gynecology

## 2014-02-21 ENCOUNTER — Other Ambulatory Visit (HOSPITAL_COMMUNITY): Payer: Self-pay | Admitting: Obstetrics and Gynecology

## 2014-02-21 DIAGNOSIS — N949 Unspecified condition associated with female genital organs and menstrual cycle: Secondary | ICD-10-CM | POA: Diagnosis not present

## 2014-02-21 DIAGNOSIS — R102 Pelvic and perineal pain: Secondary | ICD-10-CM

## 2014-02-21 DIAGNOSIS — N8320 Unspecified ovarian cysts: Secondary | ICD-10-CM | POA: Diagnosis not present

## 2014-02-21 DIAGNOSIS — Z30431 Encounter for routine checking of intrauterine contraceptive device: Secondary | ICD-10-CM | POA: Insufficient documentation

## 2014-03-16 ENCOUNTER — Telehealth: Payer: Self-pay | Admitting: Obstetrics and Gynecology

## 2014-03-29 ENCOUNTER — Emergency Department (HOSPITAL_COMMUNITY)
Admission: EM | Admit: 2014-03-29 | Discharge: 2014-03-29 | Disposition: A | Discharge status: Home / Self Care | Payer: Medicaid Other | Attending: Emergency Medicine | Admitting: Emergency Medicine

## 2014-03-29 ENCOUNTER — Encounter (HOSPITAL_COMMUNITY): Payer: Self-pay | Admitting: Adult Health

## 2014-03-29 DIAGNOSIS — K0889 Other specified disorders of teeth and supporting structures: Secondary | ICD-10-CM

## 2014-03-29 DIAGNOSIS — Z862 Personal history of diseases of the blood and blood-forming organs and certain disorders involving the immune mechanism: Secondary | ICD-10-CM | POA: Diagnosis not present

## 2014-03-29 DIAGNOSIS — K029 Dental caries, unspecified: Secondary | ICD-10-CM | POA: Insufficient documentation

## 2014-03-29 DIAGNOSIS — R110 Nausea: Secondary | ICD-10-CM | POA: Insufficient documentation

## 2014-03-29 DIAGNOSIS — J45909 Unspecified asthma, uncomplicated: Secondary | ICD-10-CM | POA: Diagnosis not present

## 2014-03-29 DIAGNOSIS — Z87442 Personal history of urinary calculi: Secondary | ICD-10-CM | POA: Diagnosis not present

## 2014-03-29 DIAGNOSIS — Z79899 Other long term (current) drug therapy: Secondary | ICD-10-CM | POA: Insufficient documentation

## 2014-03-29 DIAGNOSIS — K088 Other specified disorders of teeth and supporting structures: Secondary | ICD-10-CM | POA: Diagnosis not present

## 2014-03-29 DIAGNOSIS — Z720 Tobacco use: Secondary | ICD-10-CM | POA: Diagnosis not present

## 2014-03-29 DIAGNOSIS — Z87440 Personal history of urinary (tract) infections: Secondary | ICD-10-CM | POA: Insufficient documentation

## 2014-03-29 MED ORDER — HYDROCODONE-ACETAMINOPHEN 5-325 MG PO TABS: 1.0000 | ORAL_TABLET | ORAL | Status: DC | PRN

## 2014-03-29 MED ORDER — PENICILLIN V POTASSIUM 500 MG PO TABS: 500.0000 mg | ORAL_TABLET | Freq: Four times a day (QID) | ORAL | Status: AC

## 2014-03-29 NOTE — ED Provider Notes (Signed)
CSN: [id number]     Arrival date & time 03/29/14  1455 History  This chart was scribed for non-physician practitioner working with No att. providers found by Mercy Moore, ED Scribe. This patient was seen in room TR04C/TR04C and the patient's care was started at 6:25 PM.   Chief Complaint  Patient presents with  . Dental Pain   The history is provided by the patient. No language interpreter was used.   HPI Comments: Jocelyn Garcia is a 28 y.o. female who presents to the Emergency Department complaining of intermittent upper dental pain. Patient attributes pain to two known fractured teeth six months ago. Patient reports that her pain reoccurred three days ago.  Patient reports bilateral abscesses and reports drainage from the left side' ruptured while brushing her teeth. Patient nausea and states that she has been unable to eat much. Patient shares prior treatment with antibiotics. Patient shares that she does not have dental insurance, but is hoping her dentist will be able to work out some type of payment plan for her necessary procedures. Patient denies fever, sore throat or difficulty swallowing.    Past Medical History  Diagnosis Date  . Anemia   . Asthma     exercise induced  . Urinary tract infection   . Chronic kidney disease   . Kidney stones    Past Surgical History  Procedure Laterality Date  . Wisdom tooth extraction     Family History  Problem Relation Age of Onset  . Anesthesia problems Neg Hx   . Heart disease Mother   . Hypertension Mother   . Diabetes Mother   . Heart disease Maternal Grandmother   . Stroke Maternal Grandmother   . Hypertension Maternal Grandmother   . Cancer Maternal Grandfather    History  Substance Use Topics  . Smoking status: Current Every Day Smoker    Last Attempt to Quit: 03/09/2011  . Smokeless tobacco: Never Used  . Alcohol Use: No   OB History    Gravida Para Term Preterm AB TAB SAB Ectopic Multiple Living   2 2 2       2       Review of Systems  Constitutional: Negative for fever and chills.  HENT: Positive for dental problem. Negative for mouth sores and sore throat.   Gastrointestinal: Positive for nausea.    Allergies  Review of patient's allergies indicates no known allergies.  Home Medications   Prior to Admission medications   Medication Sig Start Date End Date Taking? Authorizing Provider  acetaminophen (TYLENOL) 500 MG tablet Take 1,000 mg by mouth every 6 (six) hours as needed for moderate pain.   Yes Historical Provider, MD  albuterol (PROVENTIL HFA;VENTOLIN HFA) 108 (90 BASE) MCG/ACT inhaler Inhale 2 puffs into the lungs every 6 (six) hours as needed. For shortness of breath 10/24/12  Yes Gwen Pounds, CNM  naproxen sodium (ANAPROX) 220 MG tablet Take 440 mg by mouth 2 (two) times daily as needed (pain).   Yes Historical Provider, MD  HYDROcodone-acetaminophen (NORCO/VICODIN) 5-325 MG per tablet Take 1-2 tablets by mouth every 4 (four) hours as needed for moderate pain or severe pain. 03/29/14   Carrie Mew, PA-C  oxyCODONE-acetaminophen (PERCOCET/ROXICET) 5-325 MG per tablet Take 1 tablet by mouth every 4 (four) hours as needed for severe pain. May take 2 tablets PO q 6 hours for severe pain - Do not take with Tylenol as this tablet already contains tylenol Patient not taking: Reported on 02/16/2014 01/17/14  Elwyn Lade, PA-C  penicillin v potassium (VEETID) 500 MG tablet Take 1 tablet (500 mg total) by mouth 4 (four) times daily. 03/29/14 04/05/14  Carrie Mew, PA-C   Triage Vitals: BP 112/64 mmHg  Pulse 66  Temp(Src) 98.2 F (36.8 C) (Oral)  Resp 14  Ht 5' 4.5" (1.638 m)  Wt 180 lb (81.647 kg)  BMI 30.43 kg/m2  SpO2 99%  LMP 03/05/2014 Physical Exam  Constitutional: She is oriented to person, place, and time. She appears well-developed and well-nourished. No distress.  HENT:  Head: Normocephalic and atraumatic.  Dental caries and broken teeth two her rear premolar  bilaterally. Mild small less 1 cm abscess which has recently drained on left lateral gumline, which is nonfluctuant, and shows no signs of further abscess at this point.  Eyes: EOM are normal.  Neck: Neck supple. No tracheal deviation present.  Cardiovascular: Normal rate.   Pulmonary/Chest: Effort normal. No respiratory distress.  Musculoskeletal: Normal range of motion.  Neurological: She is alert and oriented to person, place, and time.  Skin: Skin is warm and dry.  Psychiatric: She has a normal mood and affect. Her behavior is normal.  Nursing note and vitals reviewed.   ED Course  Procedures (including critical care time)  COORDINATION OF CARE: 6:33 PM- Will prescribed antibiotic and provide resource guide. Discussed treatment plan with patient at bedside and patient agreed to plan.   Labs Review Labs Reviewed - No data to display  Imaging Review No results found.   EKG Interpretation None      MDM   Final diagnoses:  Pain, dental    Patient with toothache.  Small area noted to be a possible abscess which looks like it may have recently drained. Patient states she drained this abscess last night while brushing her teeth, states she drained purulence fluid from it and since decreased in size. No fluctuance or areas of swelling or erythema that would require drainage today. No evidence of spread of infection. No signs of cellulitis..  Exam unconcerning for Ludwig's angina or spread of infection.  Will treat with penicillin and pain medicine.  Urged patient to follow-up with dentist.    I personally performed the services described in this documentation, which was scribed in my presence. The recorded information has been reviewed and is accurate.  BP 112/64 mmHg  Pulse 66  Temp(Src) 98.2 F (36.8 C) (Oral)  Resp 14  Ht 5' 4.5" (1.638 m)  Wt 180 lb (81.647 kg)  BMI 30.43 kg/m2  SpO2 99%  LMP 03/05/2014  Signed,  Dahlia Bailiff, PA-C 5:23 AM   Carrie Mew,  PA-C 03/30/14 8088  Dorie Rank, MD 04/01/14 1011

## 2014-03-29 NOTE — Discharge Instructions (Signed)
Follow-up with your dentist or refer to resource guide provided. Return to the ER if you develop any high fever, severe swelling, severe pain, difficulty swallowing or breathing.  Dental Pain A tooth ache may be caused by cavities (tooth decay). Cavities expose the nerve of the tooth to air and hot or cold temperatures. It may come from an infection or abscess (also called a boil or furuncle) around your tooth. It is also often caused by dental caries (tooth decay). This causes the pain you are having. DIAGNOSIS  Your caregiver can diagnose this problem by exam. TREATMENT   If caused by an infection, it may be treated with medications which kill germs (antibiotics) and pain medications as prescribed by your caregiver. Take medications as directed.  Only take over-the-counter or prescription medicines for pain, discomfort, or fever as directed by your caregiver.  Whether the tooth ache today is caused by infection or dental disease, you should see your dentist as soon as possible for further care. SEEK MEDICAL CARE IF: The exam and treatment you received today has been provided on an emergency basis only. This is not a substitute for complete medical or dental care. If your problem worsens or new problems (symptoms) appear, and you are unable to meet with your dentist, call or return to this location. SEEK IMMEDIATE MEDICAL CARE IF:   You have a fever.  You develop redness and swelling of your face, jaw, or neck.  You are unable to open your mouth.  You have severe pain uncontrolled by pain medicine. MAKE SURE YOU:   Understand these instructions.  Will watch your condition.  Will get help right away if you are not doing well or get worse. Document Released: 03/21/2005 Document Revised: 06/13/2011 Document Reviewed: 11/07/2007 [facility] Patient Information 2015 [place], [place]. This information is not intended to replace advice given to you by your health care provider. Make sure you  discuss any questions you have with your health care provider.

## 2014-03-29 NOTE — ED Notes (Signed)
Discharge instructions reviewed.

## 2014-03-29 NOTE — ED Notes (Signed)
Presents with bilateral upper back tooth pain, has been ongoing off and on for a while. This pain began on the 23rd.  Dental caries noted.

## 2014-10-06 NOTE — Telephone Encounter (Signed)
See telephone note from above

## 2014-11-20 ENCOUNTER — Encounter (HOSPITAL_COMMUNITY): Payer: Self-pay

## 2014-11-20 ENCOUNTER — Emergency Department (HOSPITAL_COMMUNITY)
Admission: EM | Admit: 2014-11-20 | Discharge: 2014-11-20 | Disposition: A | Discharge status: Home / Self Care | Payer: Medicaid Other | Attending: Emergency Medicine | Admitting: Emergency Medicine

## 2014-11-20 DIAGNOSIS — R42 Dizziness and giddiness: Secondary | ICD-10-CM | POA: Insufficient documentation

## 2014-11-20 DIAGNOSIS — Z79899 Other long term (current) drug therapy: Secondary | ICD-10-CM | POA: Diagnosis not present

## 2014-11-20 DIAGNOSIS — K047 Periapical abscess without sinus: Secondary | ICD-10-CM | POA: Insufficient documentation

## 2014-11-20 DIAGNOSIS — J45909 Unspecified asthma, uncomplicated: Secondary | ICD-10-CM | POA: Diagnosis not present

## 2014-11-20 DIAGNOSIS — N189 Chronic kidney disease, unspecified: Secondary | ICD-10-CM | POA: Insufficient documentation

## 2014-11-20 DIAGNOSIS — Z87442 Personal history of urinary calculi: Secondary | ICD-10-CM | POA: Insufficient documentation

## 2014-11-20 DIAGNOSIS — Z87440 Personal history of urinary (tract) infections: Secondary | ICD-10-CM | POA: Insufficient documentation

## 2014-11-20 DIAGNOSIS — Z720 Tobacco use: Secondary | ICD-10-CM | POA: Diagnosis not present

## 2014-11-20 DIAGNOSIS — K088 Other specified disorders of teeth and supporting structures: Secondary | ICD-10-CM | POA: Diagnosis present

## 2014-11-20 MED ORDER — HYDROCODONE-ACETAMINOPHEN 5-325 MG PO TABS: 1.0000 | ORAL_TABLET | Freq: Four times a day (QID) | ORAL | Status: DC | PRN

## 2014-11-20 MED ORDER — PENICILLIN V POTASSIUM 500 MG PO TABS: 500.0000 mg | ORAL_TABLET | Freq: Four times a day (QID) | ORAL | Status: AC

## 2014-11-20 MED ORDER — IBUPROFEN 600 MG PO TABS: 600.0000 mg | ORAL_TABLET | Freq: Four times a day (QID) | ORAL | Status: DC | PRN

## 2014-11-20 NOTE — ED Notes (Signed)
Pt presents with onset of swelling and pain to R upper tooth that she reports worsened last night.  Pt reports tooth is broken, has had abscess to same area before.

## 2014-11-20 NOTE — ED Provider Notes (Signed)
CSN: [id number]     Arrival date & time 11/20/14  1142 History  This chart was scribed for non-physician practitioner Clayton Bibles, PA-C, working with Virgel Manifold, MD by Zola Button, ED Scribe. This patient was seen in room TR05C/TR05C and the patient's care was started at 12:20 PM.      No chief complaint on file.  The history is provided by the patient. No language interpreter was used.   HPI Comments: Jocelyn Garcia is a 29 y.o. female who presents to the Emergency Department complaining of gradual onset, throbbing right upper dental pain that started last night. Patient notes that she has had intermittent pain to the area for the past year. Today she feels a knot above the tooth.  She believes she may have an infection to the area. She reports having associated lightheadedness. She has been taking Aleve, Tylenol and ibuprofen for the pain. Patient denies fever, chills, sore throat, difficulty swallowing, and SOB.   Past Medical History  Diagnosis Date  . Anemia   . Asthma     exercise induced  . Urinary tract infection   . Chronic kidney disease   . Kidney stones    Past Surgical History  Procedure Laterality Date  . Wisdom tooth extraction     Family History  Problem Relation Age of Onset  . Anesthesia problems Neg Hx   . Heart disease Mother   . Hypertension Mother   . Diabetes Mother   . Heart disease Maternal Grandmother   . Stroke Maternal Grandmother   . Hypertension Maternal Grandmother   . Cancer Maternal Grandfather    Social History  Substance Use Topics  . Smoking status: Current Every Day Smoker    Last Attempt to Quit: 03/09/2011  . Smokeless tobacco: Never Used  . Alcohol Use: No   OB History    Gravida Para Term Preterm AB TAB SAB Ectopic Multiple Living   2 2 2       2      Review of Systems  Constitutional: Negative for fever and chills.  HENT: Positive for dental problem. Negative for facial swelling, sore throat and trouble swallowing.    Respiratory: Negative for shortness of breath.   Musculoskeletal: Negative for myalgias, neck pain and neck stiffness.  Skin: Negative for color change and rash.  Allergic/Immunologic: Negative for immunocompromised state.  Neurological: Positive for light-headedness.  Psychiatric/Behavioral: Negative for self-injury.      Allergies  Review of patient's allergies indicates no known allergies.  Home Medications   Prior to Admission medications   Medication Sig Start Date End Date Taking? Authorizing Provider  acetaminophen (TYLENOL) 500 MG tablet Take 1,000 mg by mouth every 6 (six) hours as needed for moderate pain.    Historical Provider, MD  albuterol (PROVENTIL HFA;VENTOLIN HFA) 108 (90 BASE) MCG/ACT inhaler Inhale 2 puffs into the lungs every 6 (six) hours as needed. For shortness of breath 10/24/12   Gwen Pounds, CNM  naproxen sodium (ANAPROX) 220 MG tablet Take 440 mg by mouth 2 (two) times daily as needed (pain).    Historical Provider, MD   BP 120/82 mmHg  Pulse 72  Temp(Src) 98.1 F (36.7 C) (Oral)  Resp 18  Ht 5' 5"  (1.651 m)  Wt 175 lb (79.379 kg)  BMI 29.12 kg/m2  SpO2 100%  LMP 11/06/2014 (Approximate) Physical Exam  Constitutional: She appears well-developed and well-nourished. No distress.  HENT:  Head: Normocephalic and atraumatic.  Mouth/Throat: Uvula is midline and oropharynx is  clear and moist. Mucous membranes are not dry. No uvula swelling. No oropharyngeal exudate, posterior oropharyngeal edema, posterior oropharyngeal erythema or tonsillar abscesses.  Right upper molar with severe decay. Swelling and tenderness of the gingiva superior to the tooth.  Neck: Trachea normal, normal range of motion and phonation normal. Neck supple. No tracheal tenderness present. No rigidity. No tracheal deviation, no edema, no erythema and normal range of motion present.  Cardiovascular: Normal rate.   Pulmonary/Chest: Effort normal and breath sounds normal. No stridor.   Lymphadenopathy:    She has no cervical adenopathy.  Neurological: She is alert.  Skin: She is not diaphoretic.  Nursing note and vitals reviewed.   ED Course  Procedures  DIAGNOSTIC STUDIES: Oxygen Saturation is 100% on room air, normal by my interpretation.    COORDINATION OF CARE: 12:24 PM-Discussed treatment plan which includes medications and dentist follow-up with patient/guardian at bedside and patient/guardian agreed to plan.    Labs Review Labs Reviewed - No data to display  Imaging Review No results found. I have personally reviewed and evaluated these images and lab results as part of my medical decision-making.   EKG Interpretation None      MDM   Final diagnoses:  Dental abscess    Afebrile, nontoxic patient with new dental pain with likely small abscess.  No concerning findings on exam.  Doubt deep space head or neck infection.  Doubt Ludwig's angina.  D/C home with antibiotic, pain medication and dental follow up.  Discussed findings, treatment, and follow up  with patient.  Pt given return precautions.  Pt verbalizes understanding and agrees with plan.       I personally performed the services described in this documentation, which was scribed in my presence. The recorded information has been reviewed and is accurate.    Clayton Bibles, PA-C 11/20/14 [place], MD 11/21/14 [phone]

## 2014-11-20 NOTE — Discharge Instructions (Signed)
Read the information below.  Use the prescribed medication as directed.  Please discuss all new medications with your pharmacist.  Do not take additional tylenol while taking the prescribed pain medication to avoid overdose.  You may return to the Emergency Department at any time for worsening condition or any new symptoms that concern you.    If there is any possibility that you might be pregnant, please let your health care provider know and discuss this with the pharmacist to ensure medication safety.  Please call the dentist listed above within 48 hours to schedule a close follow up appointment.  If you develop fevers, swelling in your face, difficulty swallowing or breathing, return to the ER immediately for a recheck.     Dental Abscess A dental abscess is a collection of infected fluid (pus) from a bacterial infection in the inner part of the tooth (pulp). It usually occurs at the end of the tooth's root.  CAUSES   Severe tooth decay.  Trauma to the tooth that allows bacteria to enter into the pulp, such as a broken or chipped tooth. SYMPTOMS   Severe pain in and around the infected tooth.  Swelling and redness around the abscessed tooth or in the mouth or face.  Tenderness.  Pus drainage.  Bad breath.  Bitter taste in the mouth.  Difficulty swallowing.  Difficulty opening the mouth.  Nausea.  Vomiting.  Chills.  Swollen neck glands. DIAGNOSIS   A medical and dental history will be taken.  An examination will be performed by tapping on the abscessed tooth.  X-rays may be taken of the tooth to identify the abscess. TREATMENT The goal of treatment is to eliminate the infection. You may be prescribed antibiotic medicine to stop the infection from spreading. A root canal may be performed to save the tooth. If the tooth cannot be saved, it may be pulled (extracted) and the abscess may be drained.  HOME CARE INSTRUCTIONS  Only take over-the-counter or prescription  medicines for pain, fever, or discomfort as directed by your caregiver.  Rinse your mouth (gargle) often with salt water ( tsp salt in 8 oz [250 ml] of warm water) to relieve pain or swelling.  Do not drive after taking pain medicine (narcotics).  Do not apply heat to the outside of your face.  Return to your dentist for further treatment as directed. SEEK MEDICAL CARE IF:  Your pain is not helped by medicine.  Your pain is getting worse instead of better. SEEK IMMEDIATE MEDICAL CARE IF:  You have a fever or persistent symptoms for more than 2-3 days.  You have a fever and your symptoms suddenly get worse.  You have chills or a very bad headache.  You have problems breathing or swallowing.  You have trouble opening your mouth.  You have swelling in the neck or around the eye. Document Released: 03/21/2005 Document Revised: 12/14/2011 Document Reviewed: 06/29/2010 [facility] Patient Information 2015 [place], [place]. This information is not intended to replace advice given to you by your health care provider. Make sure you discuss any questions you have with your health care provider.    Emergency Department Resource Guide 1) Find a Doctor and Pay Out of Pocket Although you won't have to find out who is covered by your insurance plan, it is a good idea to ask around and get recommendations. You will then need to call the office and see if the doctor you have chosen will accept you as a new patient and  what types of options they offer for patients who are self-pay. Some doctors offer discounts or will set up payment plans for their patients who do not have insurance, but you will need to ask so you aren't surprised when you get to your appointment.  2) Contact Your Local Health Department Not all health departments have doctors that can see patients for sick visits, but many do, so it is worth a call to see if yours does. If you don't know where your local health department is, you  can check in your phone book. The CDC also has a tool to help you locate your state's health department, and many state websites also have listings of all of their local health departments.  3) Find a [facility] If your illness is not likely to be very severe or complicated, you may want to try a walk in clinic. These are popping up all over the country in pharmacies, drugstores, and shopping centers. They're usually staffed by nurse practitioners or physician assistants that have been trained to treat common illnesses and complaints. They're usually fairly quick and inexpensive. However, if you have serious medical issues or chronic medical problems, these are probably not your best option.  No Primary Care Doctor: - Call Health Connect at  [phone] - they can help you locate a primary care doctor that  accepts your insurance, provides certain services, etc. - Physician Referral Service- [phone]  Chronic Pain Problems: Organization         Address  Phone   Notes  [facility]  [phone] Patients need to be referred by their primary care doctor.   Medication Assistance: Organization         Address  Phone   Notes  [facility] Medication [facility]  [place] [place], [place] [address] [phone] --Must be a resident of [facility] -- Must have NO insurance coverage whatsoever (no Medicaid/ Medicare, etc.) -- The pt. MUST have a primary care doctor that directs their care regularly and follows them in the community   MedAssist  [phone]   [employer]  [phone]    Agencies that provide inexpensive medical care: Organization         Address  Phone   Notes  [place]  [phone]   [facility]    [phone]   [facility] [place], [place] [address] [phone]   [place] [address] [place] [phone]   Planned Parenthood    [phone]   [facility]    [phone]    [place] and [place] [address], [place] Phone:  [phone], Fax:  [phone] Hours of Operation:  9 am - 6 pm, M-F.  Also accepts Medicaid/Medicare and self-pay.  [facility] for [place], [address], [place] Phone: [phone], Fax: [phone]. Hours of Operation:  8:30 am - 5:30 pm, M-F.  Also accepts Medicaid and self-pay.  [facility] [place] [address], [place] Phone: [phone]   [place], [place], [place] [phone], Ext. [phone] Mondays & Thursdays: 7-9 AM.  First 15 patients are seen on a first come, first serve basis.    [place] Providers:  Patent attorney  Notes  [facility] [address], [place] [phone] Also accepts self-pay patients.  [facility] [address], [place]  [phone]   [place], [address], [place] [phone]   [facility] [address] [place] [phone]   Lucianne Lei [address], [place]   [phone] Only accepts [place] Access [place] patients after they have their name applied to their card.   Self-Pay (no insurance) in [facility]:  Organization         Address  Phone   Notes  Sickle Cell Patients, [facility] Internal Medicine [place] [phone]   [facility] Urgent Care [place] [phone]   [facility] Veteran  [place], [place], [place] [phone]   [facility]/Dr. Osei-Bonsu  [address] [place] or [address], [place] [phone] Phone number for both [place] and [place] locations  is the same.  Urgent Medical and [facility] [address], [place] [phone]   [facility] [address] [place] or [address] [phone]   [facility] [address] [place] [phone], phone; [phone], fax Sees patients 1st and 3rd Saturday of every month.  Must not qualify for public or private insurance (i.e. Medicaid, Medicare, [place] Health Choice, Veterans' Benefits)  Household income should be no more than 200% of the poverty level The clinic cannot treat you if you are pregnant or think you are pregnant  Sexually transmitted diseases are not treated at the clinic.    Dental Care: Organization         Address  Phone  Notes  [facility] Department of [facility] [place] [phone] Accepts children up to age 67 who are enrolled in [place] or [place]; pregnant women with a Medicaid card; and children who have applied for Medicaid or [place] Health Choice, but were declined, whose parents can pay a reduced fee at time of service.  [facility] Department of [facility]  [address], [place] [phone] Accepts children up to age 51 who are enrolled in [place] or [place]; pregnant women with a Medicaid card; and children who have applied for Medicaid or [place] Health Choice, but were declined, whose parents can pay a reduced fee at time of service.  [place] Adult Dental Access PROGRAM  [place] [phone] Patients are seen by appointment only. Walk-ins are not accepted. [place] will see patients 56 years of age and older. Monday - Tuesday (8am-5pm) Most Wednesdays (8:30-5pm) $30 per visit, cash only  [facility] Adult Dental Access PROGRAM  [address], [facility] [phone] Patients are seen by appointment only. Walk-ins are not accepted. [place] will see  patients 48 years of age and older. One Wednesday Evening (Monthly: Volunteer Based).  $30 per visit, cash only  [place]  [phone] for adults; Children under age 13, call Graduate Pediatric Dentistry at [phone]. Children aged 51-14, please call [phone] to request a pediatric application.  Dental services are provided in all areas of dental care including fillings, crowns and bridges, complete  and partial dentures, implants, gum treatment, root canals, and extractions. Preventive care is also provided. Treatment is provided to both adults and children. Patients are selected via a lottery and there is often a waiting list.   [facility] [address] [place]  [phone] [email or web address]   [facility] [address] [place], [place] [phone], Ext. [phone] Second and Fourth Thursday of each month, opens at 6:30 AM; Clinic ends at 9 AM.  Patients are seen on a first-come first-served basis, and a limited number are seen during each clinic.   [facility]  [address] Hillard Danker [place], [place] [phone]   Eligibility Requirements You must have lived in [place], [place], or [place] counties for at least the last three months.   You cannot be eligible for state or federal sponsored [employer], including [employer], [place], or [employer].   You generally cannot be eligible for healthcare insurance through your employer.    How to apply: Eligibility screenings are held every Tuesday and Wednesday afternoon from 1:00 pm until 4:00 pm. You do not need an appointment for the interview!  [facility] [address], [place], [place]   [place]  [place] Department  [place]  [phone]    Behavioral Health Resources in the Community: Intensive Outpatient  Programs Organization         Address  Phone  Notes  [place]. [address], [place], [place] [phone]   [facility] Outpatient [address], [place], [place]   ADS: Alcohol & Drug Svcs [address] [place], [place]   [place] [address]  [place], [place] or [phone]   Substance Abuse Resources Organization         Address  Phone  Notes  Alcohol and Drug Services  [phone]   [place]  [phone]   [facility] [place]   Chinita Pester  [phone]   Residential & Outpatient Substance Abuse Program  [phone]   Psychological Services Organization         Address  Phone  Notes  [facility] [place]  [place]  [phone]   [place] [address] [place] or [phone]    Mobile Crisis Teams Organization         Address  Phone  Notes  [facility], Mobile Crisis Care Unit  [phone]   [facility]otherapeutic Services  [address] [place], [place]   [place] [address], [place] [phone]    Self-Help/Support Groups Organization         Address  Phone             Notes  [place]. of [place] - variety of support groups  [facility] Call for more information  Narcotics Anonymous (NA), [facility] [address], [employer] [place]  2 meetings at this location   Special educational needs teacher         Address  Phone  Notes  ASAP Residential Treatment [place],    [place]  [place]  [address], [place] [phone], [place], [place]   [place], [place] [phone] Admissions: 8am-3pm M-F  Incentives Substance [place] [address]    [place], [place]  [phone]   [facility] [address] [place], [place], [place]   [facility] [address],  [place], [place]   [facility]ight Programs - Intensive Outpatient [address] [place] [address], [place], [place]   [facility] ([place].) [place].,  [address], [place] [phone] or [phone]   [facility] Services (RTS) [address] [place], [place] Accepts Medicaid  [facility] [place] [address]  [place] [phone] Substance Abuse/Addiction Treatment   [facility] Organization         Address  Phone  Notes  [facility]  [phone]   Domenic Schwab, PhD [address] Arlis Porta [place], [place]   [phone] or [phone]   [place], [place] [phone]   [facility] [address] [place], [place] [phone] Insurance/Medicaid/sponsorship through [facility] and Families [address] [place]                                    [place], [place] [phone] [place] [address], [place] [phone]    Dr. Adele Schilder  [phone]   [facility] Dept. 1) [address] [place] 2) [place] 3)  [place] [address], [place] [phone]  [phone]    [place] [phone] or [phone] (After Hours)

## 2015-02-07 ENCOUNTER — Encounter (HOSPITAL_COMMUNITY): Payer: Self-pay | Admitting: *Deleted

## 2015-02-07 ENCOUNTER — Emergency Department (HOSPITAL_COMMUNITY)
Admission: EM | Admit: 2015-02-07 | Discharge: 2015-02-07 | Disposition: A | Discharge status: Home / Self Care | Payer: Medicaid Other | Attending: Emergency Medicine | Admitting: Emergency Medicine

## 2015-02-07 DIAGNOSIS — J45909 Unspecified asthma, uncomplicated: Secondary | ICD-10-CM | POA: Insufficient documentation

## 2015-02-07 DIAGNOSIS — Z79899 Other long term (current) drug therapy: Secondary | ICD-10-CM | POA: Diagnosis not present

## 2015-02-07 DIAGNOSIS — K0889 Other specified disorders of teeth and supporting structures: Secondary | ICD-10-CM | POA: Insufficient documentation

## 2015-02-07 DIAGNOSIS — Z87442 Personal history of urinary calculi: Secondary | ICD-10-CM | POA: Insufficient documentation

## 2015-02-07 DIAGNOSIS — K029 Dental caries, unspecified: Secondary | ICD-10-CM | POA: Insufficient documentation

## 2015-02-07 DIAGNOSIS — Z87440 Personal history of urinary (tract) infections: Secondary | ICD-10-CM | POA: Diagnosis not present

## 2015-02-07 DIAGNOSIS — Z862 Personal history of diseases of the blood and blood-forming organs and certain disorders involving the immune mechanism: Secondary | ICD-10-CM | POA: Insufficient documentation

## 2015-02-07 DIAGNOSIS — N189 Chronic kidney disease, unspecified: Secondary | ICD-10-CM | POA: Diagnosis not present

## 2015-02-07 DIAGNOSIS — Z720 Tobacco use: Secondary | ICD-10-CM | POA: Insufficient documentation

## 2015-02-07 MED ORDER — PENICILLIN V POTASSIUM 500 MG PO TABS: 500.0000 mg | ORAL_TABLET | Freq: Three times a day (TID) | ORAL | Status: DC

## 2015-02-07 NOTE — ED Provider Notes (Signed)
CSN: [id number]     Arrival date & time 02/07/15  3875 History   First MD Initiated Contact with Patient 02/07/15 0845     Chief Complaint  Patient presents with  . Dental Problem     (Consider location/radiation/quality/duration/timing/severity/associated sxs/prior Treatment) The history is provided by the patient and medical records. No language interpreter was used.     Jocelyn Garcia is a 29 y.o. female  with a hx of anemia, asthma, kidney stones presents to the Emergency Department complaining of gradual, persistent, progressively worsening dental pain to tooth #7 onset last night.  She reports she has had a dental carie in this tooth for some time however pain worsened last night. She has not seen a dentist. She denies fever, chills, nausea, vomiting. She reports subjective facial swelling but denies difficulty breathing or swallowing.   Cold and hot make her symptoms worse. Nothing makes it better. Patient has tried ibuprofen and Tylenol without relief.    Past Medical History  Diagnosis Date  . Anemia   . Asthma     exercise induced  . Urinary tract infection   . Chronic kidney disease   . Kidney stones    Past Surgical History  Procedure Laterality Date  . Wisdom tooth extraction     Family History  Problem Relation Age of Onset  . Anesthesia problems Neg Hx   . Heart disease Mother   . Hypertension Mother   . Diabetes Mother   . Heart disease Maternal Grandmother   . Stroke Maternal Grandmother   . Hypertension Maternal Grandmother   . Cancer Maternal Grandfather    Social History  Substance Use Topics  . Smoking status: Current Every Day Smoker    Last Attempt to Quit: 03/09/2011  . Smokeless tobacco: Never Used  . Alcohol Use: No   OB History    Gravida Para Term Preterm AB TAB SAB Ectopic Multiple Living   2 2 2       2      Review of Systems  Constitutional: Negative for fever, chills and appetite change.  HENT: Positive for dental problem and  facial swelling. Negative for drooling, ear pain, nosebleeds, postnasal drip, rhinorrhea and trouble swallowing.   Eyes: Negative for pain and redness.  Respiratory: Negative for cough and wheezing.   Cardiovascular: Negative for chest pain.  Gastrointestinal: Negative for nausea, vomiting and abdominal pain.  Musculoskeletal: Negative for neck pain and neck stiffness.  Skin: Negative for color change and rash.  Neurological: Positive for headaches ( mild, throbbing). Negative for weakness and light-headedness.  All other systems reviewed and are negative.     Allergies  Review of patient's allergies indicates no known allergies.  Home Medications   Prior to Admission medications   Medication Sig Start Date End Date Taking? Authorizing Provider  acetaminophen (TYLENOL) 500 MG tablet Take 1,000 mg by mouth every 6 (six) hours as needed for moderate pain.    Historical Provider, MD  albuterol (PROVENTIL HFA;VENTOLIN HFA) 108 (90 BASE) MCG/ACT inhaler Inhale 2 puffs into the lungs every 6 (six) hours as needed. For shortness of breath 10/24/12   Gwen Pounds, CNM  HYDROcodone-acetaminophen (NORCO/VICODIN) 5-325 MG per tablet Take 1 tablet by mouth every 6 (six) hours as needed for moderate pain or severe pain. 11/20/14   Clayton Bibles, PA-C  ibuprofen (ADVIL,MOTRIN) 600 MG tablet Take 1 tablet (600 mg total) by mouth every 6 (six) hours as needed for mild pain or moderate pain. 11/20/14  Clayton Bibles, PA-C  naproxen sodium (ANAPROX) 220 MG tablet Take 440 mg by mouth 2 (two) times daily as needed (pain).    Historical Provider, MD  penicillin v potassium (VEETID) 500 MG tablet Take 1 tablet (500 mg total) by mouth 3 (three) times daily. 02/07/15   Nataly Pacifico, PA-C   BP 138/90 mmHg  Pulse 98  Temp(Src) 98.5 F (36.9 C) (Oral)  Resp 16  Ht 5' 5"  (1.651 m)  Wt 169 lb (76.658 kg)  BMI 28.12 kg/m2  SpO2 99%  LMP 01/24/2015  Breastfeeding? No Physical Exam  Constitutional: She  appears well-developed and well-nourished.  HENT:  Head: Normocephalic.  Right Ear: Tympanic membrane, external ear and ear canal normal.  Left Ear: Tympanic membrane, external ear and ear canal normal.  Nose: Nose normal. Right sinus exhibits no maxillary sinus tenderness and no frontal sinus tenderness. Left sinus exhibits no maxillary sinus tenderness and no frontal sinus tenderness.  Mouth/Throat: Uvula is midline, oropharynx is clear and moist and mucous membranes are normal. No oral lesions. Abnormal dentition. Dental caries present. No uvula swelling or lacerations. No oropharyngeal exudate, posterior oropharyngeal edema, posterior oropharyngeal erythema or tonsillar abscesses.  No gingival swelling, fluctuance or induration No gross abscess Tooth # 7 with dental carie  Eyes: Conjunctivae are normal. Pupils are equal, round, and reactive to light. Right eye exhibits no discharge. Left eye exhibits no discharge.  Neck: Normal range of motion. Neck supple.  No stridor Handling secretions without difficulty No nuchal rigidity No cervical lymphadenopathy   Cardiovascular: Normal rate, regular rhythm and normal heart sounds.   Pulmonary/Chest: Effort normal. No respiratory distress.  Equal chest rise  Abdominal: Soft. Bowel sounds are normal. She exhibits no distension. There is no tenderness.  Lymphadenopathy:    She has no cervical adenopathy.  Neurological: She is alert.  Skin: Skin is warm and dry.  Psychiatric: She has a normal mood and affect.  Nursing note and vitals reviewed.   ED Course  Procedures (including critical care time) Labs Review Labs Reviewed - No data to display  Imaging Review No results found. I have personally reviewed and evaluated these images and lab results as part of my medical decision-making.   EKG Interpretation None      MDM   Final diagnoses:  Pain due to dental caries   Jocelyn Garcia presents with dental pain.  No gross  abscess.  Exam unconcerning for Ludwig's angina or spread of infection.  Will treat with penicillin and pain medicine.  Urged patient to follow-up with dentist.    BP 138/90 mmHg  Pulse 98  Temp(Src) 98.5 F (36.9 C) (Oral)  Resp 16  Ht 5' 5"  (1.651 m)  Wt 169 lb (76.658 kg)  BMI 28.12 kg/m2  SpO2 99%  LMP 01/24/2015  Breastfeeding? No   Abigail Butts, PA-C 02/07/15 8315  Fredia Sorrow, MD 02/08/15 [phone]

## 2015-02-07 NOTE — ED Notes (Signed)
Pt reports abscess to front tooth .

## 2015-02-07 NOTE — ED Notes (Signed)
Declined W/C at D/C and was escorted to lobby by RN. 

## 2015-02-07 NOTE — Discharge Instructions (Signed)
1. Medications: Ibuprofen and tylenol for pain, penicillin, usual home medications 2. Treatment: rest, drink plenty of fluids, take medications as prescribed 3. Follow Up: Please followup with dentistry within 1 week for discussion of your diagnoses and further evaluation after today's visit; if you do not have a primary care doctor use the resource guide provided to find one; Return to the ER for high fevers, difficulty breathing, difficulty swallowing or other concerning symptoms    Emergency Department Resource Guide 1) Find a Doctor and Pay Out of Pocket Although you won't have to find out who is covered by your insurance plan, it is a good idea to ask around and get recommendations. You will then need to call the office and see if the doctor you have chosen will accept you as a new patient and what types of options they offer for patients who are self-pay. Some doctors offer discounts or will set up payment plans for their patients who do not have insurance, but you will need to ask so you aren't surprised when you get to your appointment.  2) Contact Your Local Health Department Not all health departments have doctors that can see patients for sick visits, but many do, so it is worth a call to see if yours does. If you don't know where your local health department is, you can check in your phone book. The CDC also has a tool to help you locate your state's health department, and many state websites also have listings of all of their local health departments.  3) Find a [facility] If your illness is not likely to be very severe or complicated, you may want to try a walk in clinic. These are popping up all over the country in pharmacies, drugstores, and shopping centers. They're usually staffed by nurse practitioners or physician assistants that have been trained to treat common illnesses and complaints. They're usually fairly quick and inexpensive. However, if you have serious medical issues  or chronic medical problems, these are probably not your best option.  No Primary Care Doctor: - Call Health Connect at  [phone] - they can help you locate a primary care doctor that  accepts your insurance, provides certain services, etc. - Physician Referral Service- [phone]  Chronic Pain Problems: Organization         Address  Phone   Notes  [facility]  [phone] Patients need to be referred by their primary care doctor.   Medication Assistance: Organization         Address  Phone   Notes  [facility] Medication [facility] [address] [place], [place] [address] [phone] --Must be a resident of [facility], [place] -- Must have NO insurance coverage whatsoever (no Medicaid/ Medicare, etc.) -- The pt. MUST have a primary care doctor that directs their care regularly and follows them in the community   MedAssist  [phone]   [employer]  [phone]    Agencies that provide inexpensive medical care: Organization         Address  Phone   Notes  [place]  [phone]   [facility]    [phone]   [facility] [place],  [phone]   [place] [address], [place] [phone]   Planned Parenthood    [phone]   [facility]    [phone]  Community Health and [place]  [address], [place] Phone:  [phone], Fax:  [phone] Hours of Operation:  9 am - 6 pm, M-F.  Also accepts Medicaid/Medicare and self-pay.  [facility] for [place], [address], [place] Phone: [phone], Fax: [phone]. Hours of Operation:  8:30 am - 5:30 pm, M-F.  Also accepts Medicaid and self-pay.  [facility] [place] [address], [place] Phone: [phone]   [place], [place], [place]  [phone], Ext. [phone] Mondays & Thursdays: 7-9 AM.  First 15 patients are seen on a first come, first serve basis.    [place] Providers:  Organization         Address  Phone   Notes  [facility] [address], [place] [phone] Also accepts self-pay patients.  [facility] [address], [place]  [phone]   [place], [address], [place] [phone]   [facility] [address] [place] [phone]   Lucianne Lei [address], [place]   [phone] Only accepts [place] Access [place] patients after they have their name applied to their card.   Self-Pay (no insurance) in [facility]:  Organization         Address  Phone   Notes  Sickle Cell Patients, [facility] [place] [phone]   [facility] [place] [phone]   [facility] [place]  [place], [place], [place] [phone]   [facility]/Dr. Osei-Bonsu  [address] [place] or [address], [place] [phone] Phone number for both [place] and [place] locations is the same.  Urgent Medical and [facility] [address] [place] [phone]   [facility] [address] [place] or [address] [phone]   [facility] [address] [place] [phone], phone; [phone], fax Sees patients 1st and 3rd Saturday of every month.  Must not qualify for public or private insurance (i.e. Medicaid, Medicare, [place] Health Choice, Veterans' Benefits)  Household income should be no more than 200% of the poverty level The clinic cannot treat you if you are pregnant or think you are pregnant  Sexually transmitted  diseases are not treated at the clinic.    Dental Care: Organization         Address  Phone  Notes  [facility] Department of [facility] [phone] Accepts children up to age 70 who are enrolled in [place] or [place]; pregnant women with a Medicaid card; and children who have applied for Medicaid or [place] Health Choice, but were declined, whose parents can pay a reduced fee at time of service.  [facility] Department of [facility]  [address], [place] [phone] Accepts children up to age 72 who are enrolled in [place] or [place]; pregnant women with a Medicaid card; and children who have applied for Medicaid or [place] Health Choice, but were declined, whose parents can pay a reduced fee at time of service.  [place]  [phone]  [place] [phone] Patients are seen by appointment only. Walk-ins are not accepted. [place] will see patients 12 years of age and older. Monday - Tuesday (8am-5pm) Most Wednesdays (8:30-5pm) $30 per visit, cash only  [facility] Adult Dental Access PROGRAM  [address], [facility] [phone] Patients are seen by appointment only. Walk-ins are not accepted. [place] will see patients 54 years of age and older. One Wednesday Evening (Monthly: Volunteer Based).  $30 per visit, cash only  [place]  [phone] for adults; Children under age 31, call Graduate Pediatric Dentistry at [phone]. Children aged 20-14, please call [phone] to request a pediatric application.  Dental services are provided in all areas of dental care including fillings, crowns and bridges, complete and partial dentures, implants, gum treatment, root canals, and extractions. Preventive care is also provided. Treatment is provided to both adults and children. Patients are selected via a  lottery and there is often a waiting list.   [facility] [address] [place]  [phone] [email or web address]   [facility] [address] [place], [place] [phone], Ext. [phone] Second and Fourth Thursday of each month, opens at 6:30 AM; Clinic ends at 9 AM.  Patients are seen on a first-come first-served basis, and a limited number are seen during each clinic.   [facility]  [address] [place] Danker [place], [place] [phone]   Eligibility Requirements You must have lived in [place], [place], or [place] counties for at least the last three months.   You cannot be eligible for state or federal sponsored [employer], including [employer], [place], or [employer].   You generally cannot be eligible for healthcare insurance through your employer.    How to apply: Eligibility screenings are held every Tuesday and Wednesday afternoon from 1:00 pm until 4:00 pm. You do not need an appointment for the interview!  [facility] [address], [place], [place]   [place]  [place] Department  [place]  [phone]    Behavioral Health Resources in the Community: Intensive Outpatient Programs Organization         Address  Phone  Notes  [place]. [address] [place], [place] [phone]   [facility] [address] [place], [place]   ADS: Alcohol & Drug Svcs [address] [place], [place]   [place] [address],  [place], [place] or [phone]   Substance Abuse Resources Organization         Address  Phone  Notes  Alcohol and Drug Services  [phone]   [place]  [phone]   [place]   Chinita Pester  [phone]   Residential &  Outpatient Substance Abuse Program  [phone]   Psychological Services Organization         Address  Phone  Notes  [facility] [place]  [place]  [phone]   [place] [address], [place] or [phone]    Mobile Crisis Teams Organization         Address  Phone  Notes  [facility], Mobile Crisis Care Unit  [phone]   [facility]  [address] [place], [place]  [facility] [place] [address], [address] [place] [phone]    Self-Help/Support Groups Organization         Address  Phone             Notes  [facility]ental [facility]oc[facility] of [place] - variety of support groups  [place] Call for more information  Narcotics Anonymous (NA), [facility] [address], [employer] [place]  2 meetings at this location   Special educational needs teacher         Address  Phone  Notes  [facility]idential [facility] [place],    [place]  [phone]   [facility]  [address] [place] [address], [place], [place]   [place], [place] [phone] Admissions: 8am-3pm M-F  Incentives Substance [place] [address],    [place], [place] [phone]   [facility] [address] [place], [place], [place]   [facility] [address]  [place], [place]   [facility] Programs - Intensive Outpatient [address] Kristeen [address], [place], [place]   [facility] ([place].,  [place], [place] [phone] or [phone]   [facility] ([facility]S) [address], [place], [place] Accepts Medicaid  [facility] [place] [address]  [place] [phone] Substance Abuse/Addiction Treatment   [facility] Organization          Address  Phone  Notes  [facility]  [phone]   Domenic Schwab, PhD [address] [place]lis [place], [place]   [phone] or [phone]   [place], [place] [phone]   [facility] [address] [place], [place] [phone] Insurance/Medicaid/sponsorship through [facility] and Families [address] [place]                                    [place], [place] [phone] [place] [address], [place] [phone]    Dr. Adele Schilder  [phone]   [facility] Dept. 1) [address] [place] 2) [place] 3)  [place] [address], [place] [phone]  [phone]   [place] [phone] or [phone] (After Hours)

## 2015-02-09 ENCOUNTER — Emergency Department (HOSPITAL_COMMUNITY)
Admission: EM | Admit: 2015-02-09 | Discharge: 2015-02-09 | Disposition: A | Discharge status: Home / Self Care | Payer: Medicaid Other | Attending: Emergency Medicine | Admitting: Emergency Medicine

## 2015-02-09 ENCOUNTER — Encounter (HOSPITAL_COMMUNITY): Payer: Self-pay | Admitting: Emergency Medicine

## 2015-02-09 ENCOUNTER — Emergency Department (HOSPITAL_COMMUNITY): Payer: Medicaid Other

## 2015-02-09 DIAGNOSIS — R112 Nausea with vomiting, unspecified: Secondary | ICD-10-CM | POA: Diagnosis not present

## 2015-02-09 DIAGNOSIS — Z720 Tobacco use: Secondary | ICD-10-CM | POA: Insufficient documentation

## 2015-02-09 DIAGNOSIS — N189 Chronic kidney disease, unspecified: Secondary | ICD-10-CM | POA: Diagnosis not present

## 2015-02-09 DIAGNOSIS — Z792 Long term (current) use of antibiotics: Secondary | ICD-10-CM | POA: Insufficient documentation

## 2015-02-09 DIAGNOSIS — Z87442 Personal history of urinary calculi: Secondary | ICD-10-CM | POA: Insufficient documentation

## 2015-02-09 DIAGNOSIS — K0889 Other specified disorders of teeth and supporting structures: Secondary | ICD-10-CM | POA: Diagnosis present

## 2015-02-09 DIAGNOSIS — K002 Abnormalities of size and form of teeth: Secondary | ICD-10-CM | POA: Insufficient documentation

## 2015-02-09 DIAGNOSIS — K047 Periapical abscess without sinus: Secondary | ICD-10-CM

## 2015-02-09 DIAGNOSIS — Z87440 Personal history of urinary (tract) infections: Secondary | ICD-10-CM | POA: Diagnosis not present

## 2015-02-09 DIAGNOSIS — R220 Localized swelling, mass and lump, head: Secondary | ICD-10-CM

## 2015-02-09 DIAGNOSIS — Z862 Personal history of diseases of the blood and blood-forming organs and certain disorders involving the immune mechanism: Secondary | ICD-10-CM | POA: Insufficient documentation

## 2015-02-09 DIAGNOSIS — J45909 Unspecified asthma, uncomplicated: Secondary | ICD-10-CM | POA: Diagnosis not present

## 2015-02-09 LAB — I-STAT CHEM 8, ED
BUN: 5 mg/dL — ABNORMAL LOW (ref 6–20)
CALCIUM ION: 1.12 mmol/L (ref 1.12–1.23)
CREATININE: 0.7 mg/dL (ref 0.44–1.00)
Chloride: 102 mmol/L (ref 101–111)
Glucose, Bld: 101 mg/dL — ABNORMAL HIGH (ref 65–99)
HCT: 42 % (ref 36.0–46.0)
HEMOGLOBIN: 14.3 g/dL (ref 12.0–15.0)
Potassium: 3.9 mmol/L (ref 3.5–5.1)
Sodium: 140 mmol/L (ref 135–145)
TCO2: 23 mmol/L (ref 0–100)

## 2015-02-09 MED ORDER — MORPHINE SULFATE (PF) 4 MG/ML IV SOLN
4.0000 mg | Freq: Once | INTRAVENOUS | Status: AC
  Administered 2015-02-09: 4 mg via INTRAVENOUS
  Filled 2015-02-09: qty 1

## 2015-02-09 MED ORDER — IOHEXOL 300 MG/ML  SOLN
75.0000 mL | Freq: Once | INTRAMUSCULAR | Status: AC | PRN
  Administered 2015-02-09: 75 mL via INTRAVENOUS

## 2015-02-09 MED ORDER — HYDROCODONE-ACETAMINOPHEN 5-325 MG PO TABS: 1.0000 | ORAL_TABLET | ORAL | Status: DC | PRN

## 2015-02-09 MED ORDER — CLINDAMYCIN PHOSPHATE 600 MG/50ML IV SOLN
600.0000 mg | Freq: Once | INTRAVENOUS | Status: AC
  Administered 2015-02-09: 600 mg via INTRAVENOUS
  Filled 2015-02-09: qty 50

## 2015-02-09 MED ORDER — CLINDAMYCIN HCL 300 MG PO CAPS: 300.0000 mg | ORAL_CAPSULE | Freq: Four times a day (QID) | ORAL | Status: DC

## 2015-02-09 NOTE — Discharge Instructions (Signed)

## 2015-02-09 NOTE — ED Notes (Signed)
Was seen here Saturday for same.  Started antibiotics.  Woke up this morning with swelling to right side of face.  Pain in upper right side of mouth.

## 2015-02-09 NOTE — ED Notes (Signed)
Ambulated in hall without difficulty.

## 2015-02-09 NOTE — ED Notes (Signed)
Taken to CT at this time. 

## 2015-02-09 NOTE — ED Provider Notes (Signed)
CSN: [id number]     Arrival date & time 02/09/15  0403 History   First MD Initiated Contact with Patient 02/09/15 [phone]     Chief Complaint  Patient presents with  . Dental Pain      Patient is a 29 y.o. female presenting with tooth pain. The history is provided by the patient.  Dental Pain Location:  Upper Severity:  Moderate Onset quality:  Gradual Duration:  3 days Timing:  Constant Progression:  Worsening Chronicity:  New Relieved by:  Nothing Worsened by:  Jaw movement Associated symptoms: facial swelling   Associated symptoms: no fever   Risk factors: no diabetes   pt seen on 11/5 for dental pain Started PCN and has taken multiple doses Tonight noticed increased pain/facial swelling She also reports nausea/vomiting No fever No facial trauma   Past Medical History  Diagnosis Date  . Anemia   . Asthma     exercise induced  . Urinary tract infection   . Chronic kidney disease   . Kidney stones    Past Surgical History  Procedure Laterality Date  . Wisdom tooth extraction     Family History  Problem Relation Age of Onset  . Anesthesia problems Neg Hx   . Heart disease Mother   . Hypertension Mother   . Diabetes Mother   . Heart disease Maternal Grandmother   . Stroke Maternal Grandmother   . Hypertension Maternal Grandmother   . Cancer Maternal Grandfather    Social History  Substance Use Topics  . Smoking status: Current Every Day Smoker    Last Attempt to Quit: 03/09/2011  . Smokeless tobacco: Never Used  . Alcohol Use: No   OB History    Gravida Para Term Preterm AB TAB SAB Ectopic Multiple Living   2 2 2       2      Review of Systems  Constitutional: Negative for fever.  HENT: Positive for facial swelling. Negative for trouble swallowing.   Gastrointestinal: Positive for nausea.  All other systems reviewed and are negative.     Allergies  Review of patient's allergies indicates no known allergies.  Home Medications   Prior to  Admission medications   Medication Sig Start Date End Date Taking? Authorizing Provider  acetaminophen (TYLENOL) 500 MG tablet Take 1,000 mg by mouth every 6 (six) hours as needed for moderate pain.    Historical Provider, MD  albuterol (PROVENTIL HFA;VENTOLIN HFA) 108 (90 BASE) MCG/ACT inhaler Inhale 2 puffs into the lungs every 6 (six) hours as needed. For shortness of breath 10/24/12   Gwen Pounds, CNM  HYDROcodone-acetaminophen (NORCO/VICODIN) 5-325 MG per tablet Take 1 tablet by mouth every 6 (six) hours as needed for moderate pain or severe pain. 11/20/14   Clayton Bibles, PA-C  ibuprofen (ADVIL,MOTRIN) 600 MG tablet Take 1 tablet (600 mg total) by mouth every 6 (six) hours as needed for mild pain or moderate pain. 11/20/14   [place]ton Bibles, PA-C  naproxen sodium (ANAPROX) 220 MG tablet Take 440 mg by mouth 2 (two) times daily as needed (pain).    Historical Provider, MD  penicillin v potassium (VEETID) 500 MG tablet Take 1 tablet (500 mg total) by mouth 3 (three) times daily. 02/07/15   Hannah Muthersbaugh, PA-C   BP 150/89 mmHg  Pulse 109  Temp(Src) 98.3 F (36.8 C) (Oral)  Resp 18  Ht 5' 5"  (1.651 m)  Wt 170 lb (77.111 kg)  BMI 28.29 kg/m2  SpO2 99%  LMP 01/24/2015  Physical Exam CONSTITUTIONAL: Well developed/well nourished HEAD: Normocephalic/atraumatic EYES: EOMI/PERRL ENMT: Mucous membranes moist, facial swelling to right maxilla.  No crepitus.  No external abscess.  She has no gingival abscess.  No trismus.  No stridor.  Poor dentition NECK: supple no meningeal signs CV: S1/S2 noted, no murmurs/rubs/gallops noted LUNGS: Lungs are clear to auscultation bilaterally, no apparent distress ABDOMEN: soft, nontender NEURO: Pt is awake/alert/appropriate, moves all extremitiesx4.  No facial droop.   EXTREMITIES: pulses normal/equal, full ROM SKIN: warm, color normal PSYCH: no abnormalities of mood noted, alert and oriented to situation  ED Course  Procedures  6:27 AM Ct results d/w dr  Buelah Manis with oral surgery He requests pt to call his office today at 9am to have procedure today Pt agreeable She will be monitored until 7am and discharged home (>2.5 hrs since morphine) Discussed return precautions BP 154/90 mmHg  Pulse 73  Temp(Src) 97.8 F (36.6 C) (Oral)  Resp 16  Ht 5' 5"  (1.651 m)  Wt 170 lb (77.111 kg)  BMI 28.29 kg/m2  SpO2 99%  LMP 01/24/2015  Labs Review Labs Reviewed  I-STAT CHEM 8, ED - Abnormal; Notable for the following:    BUN 5 (*)    Glucose, Bld 101 (*)    All other components within normal limits    Imaging Review Ct Maxillofacial W/cm  02/09/2015  CLINICAL DATA:  Right-sided upper mouth pain. EXAM: CT MAXILLOFACIAL WITH CONTRAST TECHNIQUE: Multidetector CT imaging of the maxillofacial structures was performed with intravenous contrast. Multiplanar CT image reconstructions were also generated. A small metallic BB was placed on the right temple in order to reliably differentiate right from left. CONTRAST:  96m OMNIPAQUE IOHEXOL 300 MG/ML  SOLN COMPARISON:  None. FINDINGS: Devitalized 7 tooth with large periapical abscess which has decompressed ventrally into the outer subperiosteal space. The subperiosteal portion of the abscess measures 11 mm and tracks towards the nasal spine. There is extensive surrounding cellulitis. No venous occlusion or soft tissue gas. No floor of mouth edema. Additional notable periapical erosion around the fourth and fourteenth teeth. Mucous retention cyst in the inferior right maxillary antrum, potentially odontogenic. No acute sinusitis or mastoiditis. Negative orbits. Partial intracranial imaging is negative. IMPRESSION: Subperiosteal abscess from the seventh tooth with extensive surrounding cellulitis. Electronically Signed   By: JMonte FantasiaM.D.   On: 02/09/2015 05:30   I have personally reviewed and evaluated these images and lab results as part of my medical decision-making.   Medications  morphine 4 MG/ML  injection 4 mg (4 mg Intravenous Given 02/09/15 0433)  clindamycin (CLEOCIN) IVPB 600 mg (0 mg Intravenous Stopped 02/09/15 0530)  iohexol (OMNIPAQUE) 300 MG/ML solution 75 mL (75 mLs Intravenous Contrast Given 02/09/15 0515)    MDM   Final diagnoses:  Facial swelling  Dental abscess    Nursing notes including past medical history and social history reviewed and considered in documentation Labs/vital reviewed myself and considered during evaluation Previous records reviewed and considered     DRipley Fraise MD 02/09/15 [phone]

## 2015-02-21 ENCOUNTER — Inpatient Hospital Stay (HOSPITAL_COMMUNITY)
Admission: AD | Admit: 2015-02-21 | Discharge: 2015-02-21 | Disposition: A | Discharge status: Home / Self Care | Payer: Medicaid Other | Source: Ambulatory Visit | Attending: Obstetrics and Gynecology | Admitting: Obstetrics and Gynecology

## 2015-02-21 ENCOUNTER — Encounter (HOSPITAL_COMMUNITY): Payer: Self-pay | Admitting: *Deleted

## 2015-02-21 DIAGNOSIS — N939 Abnormal uterine and vaginal bleeding, unspecified: Secondary | ICD-10-CM | POA: Insufficient documentation

## 2015-02-21 DIAGNOSIS — N898 Other specified noninflammatory disorders of vagina: Secondary | ICD-10-CM | POA: Insufficient documentation

## 2015-02-21 DIAGNOSIS — N938 Other specified abnormal uterine and vaginal bleeding: Secondary | ICD-10-CM

## 2015-02-21 LAB — URINALYSIS, ROUTINE W REFLEX MICROSCOPIC
BILIRUBIN URINE: NEGATIVE
Glucose, UA: NEGATIVE mg/dL
Ketones, ur: NEGATIVE mg/dL
Leukocytes, UA: NEGATIVE
Nitrite: NEGATIVE
PH: 7 (ref 5.0–8.0)
Protein, ur: NEGATIVE mg/dL
SPECIFIC GRAVITY, URINE: 1.015 (ref 1.005–1.030)

## 2015-02-21 LAB — POCT PREGNANCY, URINE: PREG TEST UR: NEGATIVE

## 2015-02-21 LAB — URINE MICROSCOPIC-ADD ON
Bacteria, UA: NONE SEEN
WBC UA: NONE SEEN WBC/hpf (ref 0–5)

## 2015-02-21 LAB — HCG, QUANTITATIVE, PREGNANCY: hCG, Beta Chain, Quant, S: <1 m[IU]/mL (ref <5)

## 2015-02-21 NOTE — MAU Note (Addendum)
Passed "something weird" this morning, "Looks like a sac."   Pt has object with her, is whitish tan, looks like ? Tissue.  Denies pain.  Pt has mirena, had spotting yesterday, is not having a period.

## 2015-02-21 NOTE — MAU Provider Note (Signed)
History     CSN: [id number]  Arrival date and time: 02/21/15 1324   First Provider Initiated Contact with Patient 02/21/15 1101      Chief Complaint  Patient presents with  . Vaginal Discharge   HPI   [name] is a 29 y.o. female [phone] presenting with concerns regarding abnormal discharge she saw this morning in the toilet after she wiped. The patient had a Mirena IUD placed 2 years ago and has had regular menstrual cycles every month since placement.  She started bleeding yesterday; this is the time her normal period would start.  Her bleeding currently is very light.  She passed a tissue like substance that concerned her and she thought is may have been a gestational sac. She is concerned that she may have had a miscarriage.  She felt her IUD strings this morning and they felt normal to her.   She denies pain Bleeding is minimal at this time.   OB History    Gravida Para Term Preterm AB TAB SAB Ectopic Multiple Living   2 2 2       2       Past Medical History  Diagnosis Date  . Anemia   . Asthma     exercise induced  . Urinary tract infection   . Chronic kidney disease   . Kidney stones     Past Surgical History  Procedure Laterality Date  . Wisdom tooth extraction      Family History  Problem Relation Age of Onset  . Anesthesia problems Neg Hx   . Heart disease Mother   . Hypertension Mother   . Diabetes Mother   . Heart disease Maternal Grandmother   . Stroke Maternal Grandmother   . Hypertension Maternal Grandmother   . Cancer Maternal Grandfather     Social History  Substance Use Topics  . Smoking status: Current Every Day Smoker -- 0.50 packs/day    Types: Cigarettes    Last Attempt to Quit: 03/09/2011  . Smokeless tobacco: Never Used  . Alcohol Use: No    Allergies: No Known Allergies  Prescriptions prior to admission  Medication Sig Dispense Refill Last Dose  . albuterol (PROVENTIL HFA;VENTOLIN HFA) 108 (90 BASE) MCG/ACT  inhaler Inhale 2 puffs into the lungs every 6 (six) hours as needed. For shortness of breath (Patient not taking: Reported on 02/21/2015) 1 Inhaler 3 inknown  . clindamycin (CLEOCIN) 300 MG capsule Take 1 capsule (300 mg total) by mouth 4 (four) times daily. X 7 days (Patient not taking: Reported on 02/21/2015) 28 capsule 0   . HYDROcodone-acetaminophen (NORCO/VICODIN) 5-325 MG tablet Take 1 tablet by mouth every 4 (four) hours as needed. (Patient not taking: Reported on 02/21/2015) 2 tablet 0     Results for orders placed or performed during the hospital encounter of 02/21/15 (from the past 48 hour(s))  Urinalysis, Routine w reflex microscopic (not at [facility])     Status: Abnormal   Collection Time: 02/21/15 10:15 AM  Result Value Ref Range   Color, Urine YELLOW YELLOW   APPearance CLEAR CLEAR   Specific Gravity, Urine 1.015 1.005 - 1.030   pH 7.0 5.0 - 8.0   Glucose, UA NEGATIVE NEGATIVE mg/dL   Hgb urine dipstick SMALL (A) NEGATIVE   Bilirubin Urine NEGATIVE NEGATIVE   Ketones, ur NEGATIVE NEGATIVE mg/dL   Protein, ur NEGATIVE NEGATIVE mg/dL   Nitrite NEGATIVE NEGATIVE   Leukocytes, UA NEGATIVE NEGATIVE  Urine microscopic-add on  Status: Abnormal   Collection Time: 02/21/15 10:15 AM  Result Value Ref Range   Squamous Epithelial / LPF 0-5 (A) NONE SEEN    Comment: Please note change in reference range.   WBC, UA NONE SEEN 0 - 5 WBC/hpf    Comment: Please note change in reference range.   RBC / HPF 6-30 0 - 5 RBC/hpf    Comment: Please note change in reference range.   Bacteria, UA NONE SEEN NONE SEEN    Comment: Please note change in reference range.  hCG, quantitative, pregnancy     Status: None   Collection Time: 02/21/15 11:20 AM  Result Value Ref Range   hCG, Beta Chain, Quant, S <1 <5 mIU/mL    Comment:          GEST. AGE      CONC.  (mIU/mL)   <=1 WEEK        5 - 50     2 WEEKS       50 - 500     3 WEEKS       100 - 10,000     4 WEEKS     1,000 - 30,000     5 WEEKS      3,500 - 115,000   6-8 WEEKS     12,000 - 270,000    12 WEEKS     15,000 - 220,000        FEMALE AND NON-PREGNANT FEMALE:     LESS THAN 5 mIU/mL REPEATED TO VERIFY   Pregnancy, urine POC     Status: None   Collection Time: 02/21/15 11:36 AM  Result Value Ref Range   Preg Test, Ur NEGATIVE NEGATIVE    Comment:        THE SENSITIVITY OF THIS METHODOLOGY IS >24 mIU/mL     Review of Systems  Constitutional: Negative for fever and chills.  Gastrointestinal: Negative for nausea, vomiting and abdominal pain.   Physical Exam   Blood pressure 136/89, pulse 84, temperature 98.1 F (36.7 C), temperature source Oral, resp. rate 18, last menstrual period 01/24/2015.  Physical Exam  Constitutional: She is oriented to person, place, and time. She appears well-developed and well-nourished. No distress.  HENT:  Head: Normocephalic.  Eyes: Pupils are equal, round, and reactive to light.  Neck: Neck supple.  Respiratory: Effort normal.  GI: Soft.  Musculoskeletal: Normal range of motion.  Neurological: She is alert and oriented to person, place, and time.  Skin: Skin is warm. She is not diaphoretic.  Psychiatric: Her behavior is normal.    MAU Course  Procedures  None  MDM Beta hcg level.  Assessment and Plan   A:  1. Vaginal discharge   2. Other specified abnormal uterine and vaginal bleeding    P:  Discharge home in stable condition Quant negative Follow up with PCP as needed.   Lezlie Lye, NP 02/21/2015 11:10 AM

## 2015-04-13 ENCOUNTER — Encounter (HOSPITAL_COMMUNITY): Payer: Self-pay

## 2015-04-13 ENCOUNTER — Emergency Department (HOSPITAL_COMMUNITY)
Admission: EM | Admit: 2015-04-13 | Discharge: 2015-04-13 | Disposition: A | Discharge status: Home / Self Care | Payer: Medicaid Other | Attending: Emergency Medicine | Admitting: Emergency Medicine

## 2015-04-13 DIAGNOSIS — Z79899 Other long term (current) drug therapy: Secondary | ICD-10-CM | POA: Diagnosis not present

## 2015-04-13 DIAGNOSIS — Z862 Personal history of diseases of the blood and blood-forming organs and certain disorders involving the immune mechanism: Secondary | ICD-10-CM | POA: Diagnosis not present

## 2015-04-13 DIAGNOSIS — K0381 Cracked tooth: Secondary | ICD-10-CM | POA: Insufficient documentation

## 2015-04-13 DIAGNOSIS — Z87440 Personal history of urinary (tract) infections: Secondary | ICD-10-CM | POA: Diagnosis not present

## 2015-04-13 DIAGNOSIS — N189 Chronic kidney disease, unspecified: Secondary | ICD-10-CM | POA: Diagnosis not present

## 2015-04-13 DIAGNOSIS — F17210 Nicotine dependence, cigarettes, uncomplicated: Secondary | ICD-10-CM | POA: Diagnosis not present

## 2015-04-13 DIAGNOSIS — Z87442 Personal history of urinary calculi: Secondary | ICD-10-CM | POA: Insufficient documentation

## 2015-04-13 DIAGNOSIS — J45909 Unspecified asthma, uncomplicated: Secondary | ICD-10-CM | POA: Diagnosis not present

## 2015-04-13 DIAGNOSIS — K0889 Other specified disorders of teeth and supporting structures: Secondary | ICD-10-CM | POA: Diagnosis present

## 2015-04-13 MED ORDER — CLINDAMYCIN HCL 300 MG PO CAPS: 300.0000 mg | ORAL_CAPSULE | Freq: Four times a day (QID) | ORAL | Status: DC

## 2015-04-13 MED ORDER — HYDROCODONE-ACETAMINOPHEN 5-325 MG PO TABS: 1.0000 | ORAL_TABLET | ORAL | Status: DC | PRN

## 2015-04-13 NOTE — ED Provider Notes (Signed)
CSN: [id number]     Arrival date & time 04/13/15  1846 History  By signing my name below, I, Stephania Fragmin, attest that this documentation has been prepared under the direction and in the presence of Alyse Low, [place]. Electronically Signed: Stephania Fragmin, ED Scribe. 04/13/2015. 8:02 PM.    Chief Complaint  Patient presents with  . Dental Pain    The history is provided by the patient. No language interpreter was used.    HPI Comments: Jocelyn Garcia is a 30 y.o. female with a history of anemia and CKD, who presents to the Emergency Department complaining of constant, severe, worsening, 8/10 upper left dental pain that began 4 days ago. Patient states she fractured her tooth while eating 4 days ago and suspects this is causing her pain. She reports she has a dental appointment scheduled for 04/22/15, in 9 days, but states the pain was too severe to wait. She states has tried flossing the area copiously to try to remove what she thought was stuck fragments of tooth, but this only aggravated her pain. Patient has NKDA. She states she has been using penicillin frequently recently and it has not been working as well for her.   Past Medical History  Diagnosis Date  . Anemia   . Asthma     exercise induced  . Urinary tract infection   . Chronic kidney disease   . Kidney stones    Past Surgical History  Procedure Laterality Date  . Wisdom tooth extraction     Family History  Problem Relation Age of Onset  . Anesthesia problems Neg Hx   . Heart disease Mother   . Hypertension Mother   . Diabetes Mother   . Heart disease Maternal Grandmother   . Stroke Maternal Grandmother   . Hypertension Maternal Grandmother   . Cancer Maternal Grandfather    Social History  Substance Use Topics  . Smoking status: Current Every Day Smoker -- 0.50 packs/day    Types: Cigarettes    Last Attempt to Quit: 03/09/2011  . Smokeless tobacco: Never Used  . Alcohol Use: No   OB History    Gravida Para Term  Preterm AB TAB SAB Ectopic Multiple Living   2 2 2       2      Review of Systems A complete 10 system review of systems was obtained and all systems are negative except as noted in the HPI and PMH.    Allergies  Review of patient's allergies indicates no known allergies.  Home Medications   Prior to Admission medications   Medication Sig Start Date End Date Taking? Authorizing Provider  albuterol (PROVENTIL HFA;VENTOLIN HFA) 108 (90 BASE) MCG/ACT inhaler Inhale 2 puffs into the lungs every 6 (six) hours as needed. For shortness of breath Patient not taking: Reported on 02/21/2015 10/24/12   Gwen Pounds, CNM  clindamycin (CLEOCIN) 300 MG capsule Take 1 capsule (300 mg total) by mouth 4 (four) times daily. X 7 days Patient not taking: Reported on 02/21/2015 02/09/15   Ripley Fraise, MD  HYDROcodone-acetaminophen (NORCO/VICODIN) 5-325 MG tablet Take 1 tablet by mouth every 4 (four) hours as needed. Patient not taking: Reported on 02/21/2015 02/09/15   Ripley Fraise, MD   BP 139/89 mmHg  Pulse 112  Temp(Src) 98.3 F (36.8 C) (Oral)  Resp 20  Ht 5' 5"  (1.651 m)  Wt 175 lb (79.379 kg)  BMI 29.12 kg/m2  SpO2 100%  LMP 03/09/2015 Physical Exam  Constitutional: She  is oriented to person, place, and time. She appears well-developed and well-nourished. No distress.  HENT:  Head: Normocephalic and atraumatic.  Left upper premolar is fractured, with surrounding gingival erythema  Eyes: Conjunctivae and EOM are normal.  Neck: Neck supple. No tracheal deviation present.  Cardiovascular: Normal rate.   Pulmonary/Chest: Effort normal. No respiratory distress.  Musculoskeletal: Normal range of motion.  Neurological: She is alert and oriented to person, place, and time.  Skin: Skin is warm and dry.  Psychiatric: She has a normal mood and affect. Her behavior is normal.  Nursing note and vitals reviewed.   ED Course  Procedures (including critical care time)  DIAGNOSTIC  STUDIES: Oxygen Saturation is 100% on RA, normal by my interpretation.    COORDINATION OF CARE: 8:00 PM - Discussed treatment plan with pt at bedside. Pt verbalized understanding and agreed to plan.   MDM   Final diagnoses:  Toothache   Patient with dentalgia.  No abscess requiring immediate incision and drainage.  Exam not concerning for Ludwig's angina or pharyngeal abscess.  Will treat with clindamycin. Pt instructed to follow-up with dentist - patient has scheduled dental appointment in 9 days on 04/22/15.  Discussed return precautions. Pt safe for discharge.  Clindamycin Hydrocodone An After Visit Summary was printed and given to the patient.    Hollace Kinnier [place], PA-C 04/13/15 2010  Sherwood Gambler, MD 04/14/15 [phone]

## 2015-04-13 NOTE — Discharge Instructions (Signed)
Dental Pain Dental pain may be caused by many things, including:  Tooth decay (cavities or caries). Cavities expose the nerve of your tooth to air and hot or cold temperatures. This can cause pain or discomfort.  Abscess or infection. A dental abscess is a collection of infected pus from a bacterial infection in the inner part of the tooth (pulp). It usually occurs at the end of the tooth's root.  Injury.  An unknown reason (idiopathic). Your pain may be mild or severe. It may only occur when:  You are chewing.  You are exposed to hot or cold temperature.  You are eating or drinking sugary foods or beverages, such as soda or candy. Your pain may also be constant. HOME CARE INSTRUCTIONS Watch your dental pain for any changes. The following actions may help to lessen any discomfort that you are feeling:  Take medicines only as directed by your dentist.  If you were prescribed an antibiotic medicine, finish all of it even if you start to feel better.  Keep all follow-up visits as directed by your dentist. This is important.  Do not apply heat to the outside of your face.  Rinse your mouth or gargle with salt water if directed by your dentist. This helps with pain and swelling.  You can make salt water by adding  tsp of salt to 1 cup of warm water.  Apply ice to the painful area of your face:  Put ice in a plastic bag.  Place a towel between your skin and the bag.  Leave the ice on for 20 minutes, 2-3 times per day.  Avoid foods or drinks that cause you pain, such as:  Very hot or very cold foods or drinks.  Sweet or sugary foods or drinks. SEEK MEDICAL CARE IF:  Your pain is not controlled with medicines.  Your symptoms are worse.  You have new symptoms. SEEK IMMEDIATE MEDICAL CARE IF:  You are unable to open your mouth.  You are having trouble breathing or swallowing.  You have a fever.  Your face, neck, or jaw is swollen.   This information is not  intended to replace advice given to you by your health care provider. Make sure you discuss any questions you have with your health care provider.   Document Released: 03/21/2005 Document Revised: 08/05/2014 Document Reviewed: 03/17/2014 Elsevier Interactive Patient Education [employer].

## 2015-04-13 NOTE — ED Notes (Signed)
Pt broke tooth off on top L side last Thursday, has appt with dentist on the 18th, pain is to bad to wait.

## 2015-10-27 ENCOUNTER — Emergency Department (HOSPITAL_COMMUNITY)
Admission: EM | Admit: 2015-10-27 | Discharge: 2015-10-27 | Disposition: A | Discharge status: Home / Self Care | Payer: Medicaid Other | Attending: Emergency Medicine | Admitting: Emergency Medicine

## 2015-10-27 ENCOUNTER — Encounter (HOSPITAL_COMMUNITY): Payer: Self-pay | Admitting: Emergency Medicine

## 2015-10-27 DIAGNOSIS — K047 Periapical abscess without sinus: Secondary | ICD-10-CM | POA: Insufficient documentation

## 2015-10-27 DIAGNOSIS — J45909 Unspecified asthma, uncomplicated: Secondary | ICD-10-CM | POA: Diagnosis not present

## 2015-10-27 DIAGNOSIS — N189 Chronic kidney disease, unspecified: Secondary | ICD-10-CM | POA: Insufficient documentation

## 2015-10-27 DIAGNOSIS — K0889 Other specified disorders of teeth and supporting structures: Secondary | ICD-10-CM | POA: Diagnosis present

## 2015-10-27 DIAGNOSIS — F17210 Nicotine dependence, cigarettes, uncomplicated: Secondary | ICD-10-CM | POA: Insufficient documentation

## 2015-10-27 MED ORDER — CLINDAMYCIN HCL 300 MG PO CAPS: 300.0000 mg | ORAL_CAPSULE | Freq: Four times a day (QID) | ORAL | 0 refills | Status: DC

## 2015-10-27 NOTE — ED Provider Notes (Signed)
[facility] DEPT Provider Note   CSN: [id number] Arrival date & time: 10/27/15  1158  First Provider Contact:  None    By signing my name below, I, Hansel Feinstein, attest that this documentation has been prepared under the direction and in the presence of Montine Circle, PA-C. Electronically Signed: Hansel Feinstein, ED Scribe. 10/27/15. 12:24 PM.    History   Chief Complaint Chief Complaint  Patient presents with  . Dental Pain    HPI Jocelyn Garcia is a 30 y.o. female who presents to the Emergency Department complaining of moderate left upper dental pain onset last week. Pt states she is scheduled for extraction of an infected tooth in 6 days and has been taking amoxicillin for 6 days. She reports that this antibiotic and Motrin have provided no relief. Pt reports h/o similar symptoms that resulted in dental extractions. Pt states that her pain is worsened with swallowing and palpation. She denies fever, difficulty swallowing or tolerating secretions.   The history is provided by the patient. No language interpreter was used.    Past Medical History:  Diagnosis Date  . Anemia   . Asthma    exercise induced  . Chronic kidney disease   . Kidney stones   . Urinary tract infection     Patient Active Problem List   Diagnosis Date Noted  . IUD check up 02/22/2013  . Normal delivery 12/08/2012  . Post term pregnancy, delivered 12/08/2012  . Anemia complicating pregnancy 12/25/3005  . Insufficient prenatal care in third trimester 10/10/2012  . Short interval between pregnancies complicating pregnancy, antepartum 10/10/2012  . Supervision of other normal pregnancy 09/25/2012  . Obesity complicating pregnancy 62/26/3335  . Late prenatal care 09/25/2012    Past Surgical History:  Procedure Laterality Date  . WISDOM TOOTH EXTRACTION      OB History    Gravida Para Term Preterm AB Living   2 2 2     2    SAB TAB Ectopic Multiple Live Births                   Home  Medications    Prior to Admission medications   Medication Sig Start Date End Date Taking? Authorizing Provider  albuterol (PROVENTIL HFA;VENTOLIN HFA) 108 (90 BASE) MCG/ACT inhaler Inhale 2 puffs into the lungs every 6 (six) hours as needed. For shortness of breath Patient not taking: Reported on 02/21/2015 10/24/12   Gwen Pounds, CNM  clindamycin (CLEOCIN) 300 MG capsule Take 1 capsule (300 mg total) by mouth 4 (four) times daily. X 7 days 04/13/15   Fransico Meadow, PA-C  HYDROcodone-acetaminophen (NORCO/VICODIN) 5-325 MG tablet Take 1 tablet by mouth every 4 (four) hours as needed. 04/13/15   Fransico Meadow, PA-C    Family History Family History  Problem Relation Age of Onset  . Heart disease Maternal Grandmother   . Stroke Maternal Grandmother   . Hypertension Maternal Grandmother   . Cancer Maternal Grandfather   . Heart disease Mother   . Hypertension Mother   . Diabetes Mother   . Anesthesia problems Neg Hx     Social History Social History  Substance Use Topics  . Smoking status: Current Every Day Smoker    Packs/day: 0.50    Types: Cigarettes    Last attempt to quit: 03/09/2011  . Smokeless tobacco: Never Used  . Alcohol use No     Allergies   Review of patient's allergies indicates no known allergies.   Review  of Systems Review of Systems  Constitutional: Negative for fever.  HENT: Positive for dental problem. Negative for trouble swallowing.      Physical Exam Updated Vital Signs BP 141/87 (BP Location: Left Arm)   Pulse 79   Temp 97.9 F (36.6 C) (Oral)   Resp 18   Ht 5' 5"  (1.651 m)   Wt 180 lb (81.6 kg)   LMP 10/13/2015   SpO2 99%   BMI 29.95 kg/m   Physical Exam  Physical Exam  Constitutional: Pt appears well-developed and well-nourished.  HENT:  Head: Normocephalic.  Right Ear: Tympanic membrane, external ear and ear canal normal.  Left Ear: Tympanic membrane, external ear and ear canal normal.  Nose: Nose normal. Right sinus exhibits  no maxillary sinus tenderness and no frontal sinus tenderness. Left sinus exhibits no maxillary sinus tenderness and no frontal sinus tenderness.  Mouth/Throat: Uvula is midline, oropharynx is clear and moist and mucous membranes are normal. No oral lesions. No uvula swelling or lacerations. No oropharyngeal exudate, posterior oropharyngeal edema, posterior oropharyngeal erythema or tonsillar abscesses.  Poor dentition No gingival swelling, fluctuance or induration No gross abscess  No sublingual edema, tenderness to palpation, or sign of Ludwig's angina, or deep space infection Pain at left upper rear molar Eyes: Conjunctivae are normal. Pupils are equal, round, and reactive to light. Right eye exhibits no discharge. Left eye exhibits no discharge.  Neck: Normal range of motion. Neck supple.  No stridor Handling secretions without difficulty No nuchal rigidity No cervical lymphadenopathy Cardiovascular: Normal rate, regular rhythm and normal heart sounds.   Pulmonary/Chest: Effort normal. No respiratory distress.  Equal chest rise  Abdominal: Soft. Bowel sounds are normal. Pt exhibits no distension. There is no tenderness.  Lymphadenopathy: Pt has no cervical adenopathy.  Neurological: Pt is alert and oriented x 4  Skin: Skin is warm and dry.  Psychiatric: Pt has a normal mood and affect.  Nursing note and vitals reviewed.   ED Treatments / Results  Labs (all labs ordered are listed, but only abnormal results are displayed) Labs Reviewed - No data to display  EKG  EKG Interpretation None       Radiology No results found.  Procedures Procedures (including critical care time)  DIAGNOSTIC STUDIES: Oxygen Saturation is 99% on RA, normal by my interpretation.    COORDINATION OF CARE: 12:22 PM Discussed treatment plan with pt at bedside which includes Clindamycin and pt agreed to plan.    Medications Ordered in ED Medications - No data to display   Initial Impression /  Assessment and Plan / ED Course  I have reviewed the triage vital signs and the nursing notes.  Pertinent labs & imaging results that were available during my care of the patient were reviewed by me and considered in my medical decision making (see chart for details).  Clinical Course    Patient with dentalgia.  No abscess requiring immediate incision and drainage.  Exam not concerning for Ludwig's angina or pharyngeal abscess.  Will treat with clinda. Pt instructed to follow-up with dentist.  Discussed return precautions. Pt safe for discharge.   Final Clinical Impressions(s) / ED Diagnoses   Final diagnoses:  Dental infection    New Prescriptions Current Discharge Medication List     I personally performed the services described in this documentation, which was scribed in my presence. The recorded information has been reviewed and is accurate.      Montine Circle, PA-C 10/27/15 [place],  MD 10/31/15 1501

## 2016-09-14 ENCOUNTER — Encounter (HOSPITAL_COMMUNITY): Payer: Self-pay | Admitting: *Deleted

## 2016-09-14 ENCOUNTER — Inpatient Hospital Stay (HOSPITAL_COMMUNITY)
Admission: AD | Admit: 2016-09-14 | Discharge: 2016-09-14 | Disposition: A | Discharge status: Home / Self Care | Payer: Medicaid Other | Source: Ambulatory Visit | Attending: Obstetrics & Gynecology | Admitting: Obstetrics & Gynecology

## 2016-09-14 DIAGNOSIS — K5900 Constipation, unspecified: Secondary | ICD-10-CM | POA: Insufficient documentation

## 2016-09-14 DIAGNOSIS — Z8249 Family history of ischemic heart disease and other diseases of the circulatory system: Secondary | ICD-10-CM | POA: Insufficient documentation

## 2016-09-14 DIAGNOSIS — J45909 Unspecified asthma, uncomplicated: Secondary | ICD-10-CM | POA: Insufficient documentation

## 2016-09-14 DIAGNOSIS — F17210 Nicotine dependence, cigarettes, uncomplicated: Secondary | ICD-10-CM | POA: Insufficient documentation

## 2016-09-14 DIAGNOSIS — N189 Chronic kidney disease, unspecified: Secondary | ICD-10-CM | POA: Insufficient documentation

## 2016-09-14 DIAGNOSIS — R102 Pelvic and perineal pain: Secondary | ICD-10-CM

## 2016-09-14 DIAGNOSIS — Z3202 Encounter for pregnancy test, result negative: Secondary | ICD-10-CM | POA: Insufficient documentation

## 2016-09-14 DIAGNOSIS — Z87442 Personal history of urinary calculi: Secondary | ICD-10-CM | POA: Insufficient documentation

## 2016-09-14 DIAGNOSIS — Z809 Family history of malignant neoplasm, unspecified: Secondary | ICD-10-CM | POA: Insufficient documentation

## 2016-09-14 DIAGNOSIS — Z841 Family history of disorders of kidney and ureter: Secondary | ICD-10-CM | POA: Insufficient documentation

## 2016-09-14 DIAGNOSIS — Z823 Family history of stroke: Secondary | ICD-10-CM | POA: Insufficient documentation

## 2016-09-14 DIAGNOSIS — Z833 Family history of diabetes mellitus: Secondary | ICD-10-CM | POA: Insufficient documentation

## 2016-09-14 DIAGNOSIS — D631 Anemia in chronic kidney disease: Secondary | ICD-10-CM | POA: Insufficient documentation

## 2016-09-14 DIAGNOSIS — Z87440 Personal history of urinary (tract) infections: Secondary | ICD-10-CM | POA: Insufficient documentation

## 2016-09-14 HISTORY — DX: Anxiety disorder, unspecified: F41.9

## 2016-09-14 LAB — URINALYSIS, ROUTINE W REFLEX MICROSCOPIC
BILIRUBIN URINE: NEGATIVE
Bacteria, UA: NONE SEEN
Glucose, UA: NEGATIVE mg/dL
KETONES UR: NEGATIVE mg/dL
LEUKOCYTES UA: NEGATIVE
NITRITE: NEGATIVE
Protein, ur: NEGATIVE mg/dL
SPECIFIC GRAVITY, URINE: 1.014 (ref 1.005–1.030)
pH: 7 (ref 5.0–8.0)

## 2016-09-14 LAB — POCT PREGNANCY, URINE: Preg Test, Ur: NEGATIVE

## 2016-09-14 LAB — WET PREP, GENITAL
CLUE CELLS WET PREP: NONE SEEN
Sperm: NONE SEEN
TRICH WET PREP: NONE SEEN
Yeast Wet Prep HPF POC: NONE SEEN

## 2016-09-14 MED ORDER — IBUPROFEN 600 MG PO TABS: 600.0000 mg | ORAL_TABLET | Freq: Four times a day (QID) | ORAL | 0 refills | Status: DC | PRN

## 2016-09-14 NOTE — MAU Provider Note (Signed)
History     CSN: [id number]  Arrival date and time: 09/14/16 1209   First Provider Initiated Contact with Patient 09/14/16 1257      Chief Complaint  Patient presents with  . Abdominal Pain   HPI   [name] is a 31 y.o. female G2P2002 here in MAU with abdominal pain. She has had trouble with constipation. She has been taking stool softeners and laxatives for the last few days and has had multiple episodes of loose stool. She states she has taken 4 laxatives in the last week. The pelvic pain has been going on for 2-3 months. The pain comes and goes. She feels very bloated.  Says she is certain that she has endometriosis and wants to know how she can get treated. She currently has a mirena in place. No issues or complaints with that at this time.   + painful intercourse "around the cervix".   OB History    Gravida Para Term Preterm AB Living   2 2 2     2    SAB TAB Ectopic Multiple Live Births           2      Past Medical History:  Diagnosis Date  . Anemia   . Anxiety   . Asthma    exercise induced  . Chronic kidney disease   . Headache   . Kidney stones   . Urinary tract infection     Past Surgical History:  Procedure Laterality Date  . NO PAST SURGERIES    . WISDOM TOOTH EXTRACTION      Family History  Problem Relation Age of Onset  . Heart disease Maternal Grandmother   . Stroke Maternal Grandmother   . Hypertension Maternal Grandmother   . Cancer Maternal Grandfather   . Heart disease Mother   . Hypertension Mother   . Diabetes Mother   . Endometriosis Mother   . Kidney disease Mother        stagae 3 renal failure  . Cancer Paternal Grandmother   . Anesthesia problems Neg Hx     Social History  Substance Use Topics  . Smoking status: Current Every Day Smoker    Packs/day: 0.50    Years: 10.00    Types: Cigarettes  . Smokeless tobacco: Never Used  . Alcohol use No    Allergies: No Known Allergies  Prescriptions Prior to Admission   Medication Sig Dispense Refill Last Dose  . albuterol (PROVENTIL HFA;VENTOLIN HFA) 108 (90 BASE) MCG/ACT inhaler Inhale 2 puffs into the lungs every 6 (six) hours as needed. For shortness of breath (Patient not taking: Reported on 02/21/2015) 1 Inhaler 3 inknown  . clindamycin (CLEOCIN) 300 MG capsule Take 1 capsule (300 mg total) by mouth 4 (four) times daily. X 7 days 28 capsule 0   . HYDROcodone-acetaminophen (NORCO/VICODIN) 5-325 MG tablet Take 1 tablet by mouth every 4 (four) hours as needed. 10 tablet 0    Results for orders placed or performed during the hospital encounter of 09/14/16 (from the past 48 hour(s))  Urinalysis, Routine w reflex microscopic     Status: Abnormal   Collection Time: 09/14/16 12:30 PM  Result Value Ref Range   Color, Urine YELLOW YELLOW   APPearance HAZY (A) CLEAR   Specific Gravity, Urine 1.014 1.005 - 1.030   pH 7.0 5.0 - 8.0   Glucose, UA NEGATIVE NEGATIVE mg/dL   Hgb urine dipstick MODERATE (A) NEGATIVE   Bilirubin Urine NEGATIVE NEGATIVE  Ketones, ur NEGATIVE NEGATIVE mg/dL   Protein, ur NEGATIVE NEGATIVE mg/dL   Nitrite NEGATIVE NEGATIVE   Leukocytes, UA NEGATIVE NEGATIVE   RBC / HPF 6-30 0 - 5 RBC/hpf   WBC, UA 0-5 0 - 5 WBC/hpf   Bacteria, UA NONE SEEN NONE SEEN   Squamous Epithelial / LPF 0-5 (A) NONE SEEN   Mucous PRESENT   Pregnancy, urine POC     Status: None   Collection Time: 09/14/16 12:40 PM  Result Value Ref Range   Preg Test, Ur NEGATIVE NEGATIVE    Comment:        THE SENSITIVITY OF THIS METHODOLOGY IS >24 mIU/mL   Wet prep, genital     Status: Abnormal   Collection Time: 09/14/16  1:10 PM  Result Value Ref Range   Yeast Wet Prep HPF POC NONE SEEN NONE SEEN    Comment: Specimen diluted due to transport tube containing more than 1 ml of saline, interpret results with caution.   Trich, Wet Prep NONE SEEN NONE SEEN   Clue Cells Wet Prep HPF POC NONE SEEN NONE SEEN   WBC, Wet Prep HPF POC MODERATE (A) NONE SEEN    Comment: FEW  BACTERIA SEEN   Sperm NONE SEEN     Review of Systems  Constitutional: Negative for fever.  Gastrointestinal: Positive for constipation, diarrhea and nausea. Negative for vomiting.  Genitourinary: Positive for pelvic pain.   Physical Exam   Blood pressure 129/81, pulse 93, temperature 98.4 F (36.9 C), temperature source Oral, resp. rate 16, weight 186 lb 8 oz (84.6 kg), last menstrual period 08/22/2016, SpO2 99 %.  Physical Exam  Constitutional: She is oriented to person, place, and time. She appears well-developed and well-nourished. No distress.  HENT:  Head: Normocephalic.  Eyes: Pupils are equal, round, and reactive to light.  GI: Soft. Bowel sounds are normal. She exhibits no distension and no mass. There is no tenderness. There is no rebound and no guarding.  Genitourinary:  Genitourinary Comments: Vagina - Small amount of white vaginal discharge, no odor  Cervix - No contact bleeding, no active bleeding  Bimanual exam: Cervix closed, no CMT Uterus non tender, normal size Adnexa non tender, no masses bilaterally GC/Chlam, wet prep done Chaperone present for exam.   Musculoskeletal: Normal range of motion.  Neurological: She is alert and oriented to person, place, and time.  Skin: Skin is warm. She is not diaphoretic.  Psychiatric: Her behavior is normal.   MAU Course  Procedures  None  MDM   Assessment and Plan   A:  1. Pelvic pain in female     P:  Discharge home in stable condition Follow up with PCP- contact information given  Patient needs GYN visit.  Return to MAU if symptoms worsen for emergencies only   Rasch, Artist Pais, NP 09/16/2016 8:25 AM

## 2016-09-14 NOTE — MAU Note (Signed)
Feels swollen and really bloated.  Cramping, menstrual pain seems to be getting worse.  Had been constipated, seems to have moved on, but pains continue. Pain comes and goes, but always in same specific place. (left lower and rt upper).  Mom thought she needed to come get checked out and to tell us that she had endometriosis.

## 2016-09-14 NOTE — Discharge Instructions (Signed)
Pelvic Pain, Female Pelvic pain is pain in your lower belly (abdomen), below your belly button and between your hips. The pain may start suddenly (acute), keep coming back (recurring), or last a long time (chronic). Pelvic pain that lasts longer than six months is considered chronic. There are many causes of pelvic pain. Sometimes the cause of your pelvic pain is not known. Follow these instructions at home:  Take over-the-counter and prescription medicines only as told by your doctor.  Rest as told by your doctor.  Do not have sex it if hurts.  Keep a journal of your pelvic pain. Write down: ? When the pain started. ? Where the pain is located. ? What seems to make the pain better or worse, such as food or your menstrual cycle. ? Any symptoms you have along with the pain.  Keep all follow-up visits as told by your doctor. This is important. Contact a doctor if:  Medicine does not help your pain.  Your pain comes back.  You have new symptoms.  You have unusual vaginal discharge or bleeding.  You have a fever or chills.  You are having a hard time pooping (constipation).  You have blood in your pee (urine) or poop (stool).  Your pee smells bad.  You feel weak or lightheaded. Get help right away if:  You have sudden pain that is very bad.  Your pain continues to get worse.  You have very bad pain and also have any of the following symptoms: ? A fever. ? Feeling stick to your stomach (nausea). ? Throwing up (vomiting). ? Being very sweaty.  You pass out (lose consciousness). This information is not intended to replace advice given to you by your health care provider. Make sure you discuss any questions you have with your health care provider. Document Released: 09/07/2007 Document Revised: 04/15/2015 Document Reviewed: 01/09/2015 Elsevier Interactive Patient Education  [employer].

## 2016-09-15 LAB — GC/CHLAMYDIA PROBE AMP (~~LOC~~) NOT AT ARMC
CHLAMYDIA, DNA PROBE: NEGATIVE
NEISSERIA GONORRHEA: NEGATIVE

## 2016-11-16 ENCOUNTER — Encounter (HOSPITAL_COMMUNITY): Payer: Self-pay

## 2016-11-16 ENCOUNTER — Emergency Department (HOSPITAL_COMMUNITY)
Admission: EM | Admit: 2016-11-16 | Discharge: 2016-11-16 | Disposition: A | Discharge status: Home / Self Care | Payer: Self-pay | Attending: Emergency Medicine | Admitting: Emergency Medicine

## 2016-11-16 DIAGNOSIS — Z87891 Personal history of nicotine dependence: Secondary | ICD-10-CM | POA: Insufficient documentation

## 2016-11-16 DIAGNOSIS — R110 Nausea: Secondary | ICD-10-CM | POA: Insufficient documentation

## 2016-11-16 DIAGNOSIS — N189 Chronic kidney disease, unspecified: Secondary | ICD-10-CM | POA: Insufficient documentation

## 2016-11-16 DIAGNOSIS — R1031 Right lower quadrant pain: Secondary | ICD-10-CM | POA: Insufficient documentation

## 2016-11-16 DIAGNOSIS — J45909 Unspecified asthma, uncomplicated: Secondary | ICD-10-CM | POA: Insufficient documentation

## 2016-11-16 DIAGNOSIS — K5900 Constipation, unspecified: Secondary | ICD-10-CM | POA: Insufficient documentation

## 2016-11-16 DIAGNOSIS — Z79899 Other long term (current) drug therapy: Secondary | ICD-10-CM | POA: Insufficient documentation

## 2016-11-16 DIAGNOSIS — K219 Gastro-esophageal reflux disease without esophagitis: Secondary | ICD-10-CM | POA: Insufficient documentation

## 2016-11-16 LAB — COMPREHENSIVE METABOLIC PANEL
ALK PHOS: 47 U/L (ref 38–126)
ALT: 15 U/L (ref 14–54)
AST: 14 U/L — ABNORMAL LOW (ref 15–41)
Albumin: 4.1 g/dL (ref 3.5–5.0)
Anion gap: 5 (ref 5–15)
BILIRUBIN TOTAL: 0.9 mg/dL (ref 0.3–1.2)
BUN: 5 mg/dL — ABNORMAL LOW (ref 6–20)
CALCIUM: 9.3 mg/dL (ref 8.9–10.3)
CO2: 29 mmol/L (ref 22–32)
Chloride: 107 mmol/L (ref 101–111)
Creatinine, Ser: 0.63 mg/dL (ref 0.44–1.00)
GFR calc non Af Amer: >60 mL/min (ref >60)
Glucose, Bld: 77 mg/dL (ref 65–99)
Potassium: 4 mmol/L (ref 3.5–5.1)
Sodium: 141 mmol/L (ref 135–145)
TOTAL PROTEIN: 7.1 g/dL (ref 6.5–8.1)

## 2016-11-16 LAB — URINALYSIS, ROUTINE W REFLEX MICROSCOPIC
BACTERIA UA: NONE SEEN
BILIRUBIN URINE: NEGATIVE
Glucose, UA: NEGATIVE mg/dL
Ketones, ur: NEGATIVE mg/dL
Leukocytes, UA: NEGATIVE
NITRITE: NEGATIVE
PH: 8 (ref 5.0–8.0)
Protein, ur: NEGATIVE mg/dL
SPECIFIC GRAVITY, URINE: 1.004 — AB (ref 1.005–1.030)
WBC UA: NONE SEEN WBC/hpf (ref 0–5)

## 2016-11-16 LAB — LIPASE, BLOOD: Lipase: 25 U/L (ref 11–51)

## 2016-11-16 LAB — CBC
HEMATOCRIT: 37.1 % (ref 36.0–46.0)
HEMOGLOBIN: 12.1 g/dL (ref 12.0–15.0)
MCH: 27.4 pg (ref 26.0–34.0)
MCHC: 32.6 g/dL (ref 30.0–36.0)
MCV: 84.1 fL (ref 78.0–100.0)
Platelets: 202 10*3/uL (ref 150–400)
RBC: 4.41 MIL/uL (ref 3.87–5.11)
RDW: 14.4 % (ref 11.5–15.5)
WBC: 8.1 10*3/uL (ref 4.0–10.5)

## 2016-11-16 LAB — I-STAT BETA HCG BLOOD, ED (MC, WL, AP ONLY): I-stat hCG, quantitative: <5 m[IU]/mL (ref <5)

## 2016-11-16 MED ORDER — OMEPRAZOLE 20 MG PO CPDR: 20.0000 mg | DELAYED_RELEASE_CAPSULE | Freq: Every day | ORAL | 0 refills | Status: DC

## 2016-11-16 MED ORDER — ONDANSETRON HCL 4 MG PO TABS: 4.0000 mg | ORAL_TABLET | Freq: Three times a day (TID) | ORAL | 0 refills | Status: DC | PRN

## 2016-11-16 MED ORDER — DOCUSATE SODIUM 100 MG PO CAPS: 100.0000 mg | ORAL_CAPSULE | Freq: Two times a day (BID) | ORAL | 0 refills | Status: DC

## 2016-11-16 MED ORDER — SIMETHICONE 80 MG PO CHEW: 80.0000 mg | CHEWABLE_TABLET | Freq: Four times a day (QID) | ORAL | 0 refills | Status: DC | PRN

## 2016-11-16 NOTE — ED Provider Notes (Signed)
[facility] DEPT Provider Note   CSN: [id number] Arrival date & time: 11/16/16  1706     History   Chief Complaint Chief Complaint  Patient presents with  . Abdominal Pain    HPI Jocelyn Garcia is a 31 y.o. female.  HPI   Jocelyn Garcia is a 31yo female with no significant past medical history who presents to the Emergency Department for evaluation of a "knot" on the right side of her abdomen which is associated with constipation and bloating. She states that she has felt the knot on her abdomen for a few weeks now and believes it might be getting bigger. Over the past few days she has had some mild pinching pain over the spot when she bends over, coughs or wears tight pants. She is worried that something in her intestines is "stuck" and believes it is causing her to be constipated. She states that she has about one bowel movement a day, but it can be small and she takes an OTC women's laxative to assist her in having a bowel movement. She also feels bloated and gassy at times. Of note, she has anemia and takes PO iron. At times she has nausea, but has not vomited and is not nauseated today. Patient is worried that she might have endometriosis, because it runs in her family and she occasionally has pain with intercourse. She states that she has an appointment with a women's health clinic coming up in September. Her last menstrual period was 3 weeks ago and normal. She uses an IUD for contraception. She is able to pass gas and denies hematochezia, melena, appetite change, fever, night sweats, fatigue, dysuria, vaginal discharge. No previous abdominal surgeries.   Past Medical History:  Diagnosis Date  . Anemia   . Anxiety   . Asthma    exercise induced  . Chronic kidney disease   . Headache   . Kidney stones   . Urinary tract infection     Patient Active Problem List   Diagnosis Date Noted  . IUD check up 02/22/2013  . Normal delivery 12/08/2012  . Post term pregnancy,  delivered 12/08/2012  . Anemia complicating pregnancy 58/30/9407  . Insufficient prenatal care in third trimester 10/10/2012  . Short interval between pregnancies complicating pregnancy, antepartum 10/10/2012  . Supervision of other normal pregnancy 09/25/2012  . Obesity complicating pregnancy 68/11/8108  . Late prenatal care 09/25/2012    Past Surgical History:  Procedure Laterality Date  . NO PAST SURGERIES    . WISDOM TOOTH EXTRACTION      OB History    Gravida Para Term Preterm AB Living   2 2 2     2    SAB TAB Ectopic Multiple Live Births           2       Home Medications    Prior to Admission medications   Medication Sig Start Date End Date Taking? Authorizing Provider  docusate sodium (COLACE) 100 MG capsule Take 1 capsule (100 mg total) by mouth every 12 (twelve) hours. 11/16/16   Glyn Ade, PA-C  ferrous sulfate 325 (65 FE) MG tablet Take 325 mg by mouth daily with breakfast.    [provider]  ibuprofen (ADVIL,MOTRIN) 600 MG tablet Take 1 tablet (600 mg total) by mouth every 6 (six) hours as needed. 09/14/16   Rasch, Anderson Malta I, NP  omeprazole (PRILOSEC) 20 MG capsule Take 1 capsule (20 mg total) by mouth daily. 11/16/16   Geanie Kenning  J, PA-C  ondansetron (ZOFRAN) 4 MG tablet Take 1 tablet (4 mg total) by mouth every 8 (eight) hours as needed for nausea or vomiting. 11/16/16   Glyn Ade, PA-C  simethicone (GAS-X) 80 MG chewable tablet Chew 1 tablet (80 mg total) by mouth every 6 (six) hours as needed for flatulence. 11/16/16   Glyn Ade, PA-C    Family History Family History  Problem Relation Age of Onset  . Heart disease Maternal Grandmother   . Stroke Maternal Grandmother   . Hypertension Maternal Grandmother   . Cancer Maternal Grandfather   . Heart disease Mother   . Hypertension Mother   . Diabetes Mother   . Endometriosis Mother   . Kidney disease Mother        stagae 3 renal failure  . Cancer Paternal Grandmother    . Anesthesia problems Neg Hx     Social History Social History  Substance Use Topics  . Smoking status: Current Every Day Smoker    Packs/day: 0.50    Years: 10.00    Types: Cigarettes  . Smokeless tobacco: Never Used  . Alcohol use No     Allergies   Patient has no known allergies.   Review of Systems Review of Systems  Constitutional: Negative for appetite change, chills, fatigue, fever and unexpected weight change.  HENT: Negative for ear pain.   Eyes: Negative for pain.  Respiratory: Negative for cough, shortness of breath and wheezing.   Cardiovascular: Negative for chest pain.  Gastrointestinal: Positive for abdominal pain, constipation and nausea. Negative for abdominal distention, anal bleeding, blood in stool and vomiting.  Genitourinary: Negative for difficulty urinating, dysuria, flank pain, hematuria, menstrual problem and vaginal discharge.  Musculoskeletal: Negative for arthralgias, gait problem and joint swelling.  Skin: Negative for color change and rash.  Neurological: Negative for light-headedness and headaches.  Psychiatric/Behavioral: Negative for agitation.     Physical Exam Updated Vital Signs BP (!) 154/88 (BP Location: Left Arm)   Pulse 71   Temp 98.4 F (36.9 C) (Oral)   Resp 16   Ht 5' 4"  (1.626 m)   Wt 86.2 kg (190 lb)   SpO2 100%   BMI 32.61 kg/m   Physical Exam  Constitutional: She appears well-developed and well-nourished. No distress.  HENT:  Head: Normocephalic and atraumatic.  Eyes: Pupils are equal, round, and reactive to light. Conjunctivae are normal. Right eye exhibits no discharge. Left eye exhibits no discharge.  Cardiovascular: Normal rate and regular rhythm.  Exam reveals no gallop and no friction rub.   No murmur heard. Pulmonary/Chest: Effort normal. No respiratory distress.  Abdominal:  Abdomen is soft, nondistended. Bowel sounds present in all four quadrants. Resonant to percussion in all four quadrants. Nontender  to light and deep palpation in all four quadrants. No masses appreciated with palpation. No guarding or rigidity. Murphy's sign negative. McBurney's point negative.    Musculoskeletal: Normal range of motion. She exhibits no edema or deformity.  Neurological: She is alert. Coordination normal.  Skin: Skin is warm and dry. She is not diaphoretic. No erythema.  Psychiatric: She has a normal mood and affect. Her behavior is normal.  Nursing note and vitals reviewed.    ED Treatments / Results  Labs (all labs ordered are listed, but only abnormal results are displayed) Labs Reviewed  COMPREHENSIVE METABOLIC PANEL - Abnormal; Notable for the following:       Result Value   BUN 5 (*)    AST 14 (*)  All other components within normal limits  URINALYSIS, ROUTINE W REFLEX MICROSCOPIC - Abnormal; Notable for the following:    Specific Gravity, Urine 1.004 (*)    Hgb urine dipstick SMALL (*)    Squamous Epithelial / LPF 0-5 (*)    All other components within normal limits  LIPASE, BLOOD  CBC  I-STAT BETA HCG BLOOD, ED ([facility], [facility], [facility] ONLY)    EKG  EKG Interpretation None       Radiology No results found.  Procedures Procedures (including critical care time)  Medications Ordered in ED Medications - No data to display   Initial Impression / Assessment and Plan / ED Course  I have reviewed the triage vital signs and the nursing notes.  Pertinent labs & imaging results that were available during my care of the patient were reviewed by me and considered in my medical decision making (see chart for details).   Patient presented to the Emergency Department for evaluation of feeling a "knot" to the right of her umbilicus which has been ongoing for several weeks and is associated with occasional pinching pain while bending over, coughing, wearing tight pants. The knot that the patient feels cannot be palpated on physical exam and she is nontender with palpation. No peritoneal signs. CBC,  CMP, Lipase, beta-hCG are within normal limits. Her UA is unremarkable for an infectious process or kidney stone. Doubt that this is a hernia causing an obstruction, as patient has not vomited and continues to be able to pass gas and stools daily. I do not believe that the patient's symptoms are due to pelvic pathology as she denies vaginal discharge, pain.   I do not believe patient needs imaging at this time as I am not concerned for acute intraabdominal pathology given the timeframe of symptoms, abdominal exam and unremarkable labwork. Patient likely is having abdominal pain related to constipation which can be treated with stool softener. Discussed choosing high fiber food groups for constipation. Will also give a prescription for omeprazole for symptom relief, and she may continue taking gasexx. She does not have a PCP, patient was given information to [facility] to establish care. Discussed return precautions and patient voiced understanding.   Final Clinical Impressions(s) / ED Diagnoses   Final diagnoses:  Right lower quadrant abdominal pain  Nausea  Gastroesophageal reflux disease, esophagitis presence not specified  Constipation, unspecified constipation type    New Prescriptions New Prescriptions   DOCUSATE SODIUM (COLACE) 100 MG CAPSULE    Take 1 capsule (100 mg total) by mouth every 12 (twelve) hours.   OMEPRAZOLE (PRILOSEC) 20 MG CAPSULE    Take 1 capsule (20 mg total) by mouth daily.   ONDANSETRON (ZOFRAN) 4 MG TABLET    Take 1 tablet (4 mg total) by mouth every 8 (eight) hours as needed for nausea or vomiting.   SIMETHICONE (GAS-X) 80 MG CHEWABLE TABLET    Chew 1 tablet (80 mg total) by mouth every 6 (six) hours as needed for flatulence.     Glyn Ade, PA-C 11/16/16 2108    Nat Christen, MD 11/19/16 [phone]

## 2016-11-16 NOTE — ED Provider Notes (Signed)
Patient seen in conjunction with orienting PA Geanie Kenning, PA-C. Please see her note for further. Briefly, patient presented with several weeks of mild midabdominal pain with associated burping, belching and bloating. She had some mild nausea but no vomiting or diarrhea. She denies urinary symptoms. No previous abdominal surgeries. She's been passing lots of gas. On exam the patient is afebrile nontoxic appearing. Her abdomen is soft and nontender to palpation. No peritoneal signs. No masses noted. Urinalysis is without sign of infection. Pregnancy test is negative. Blood work here is unremarkable. She is tolerating by mouth. We'll discharge with prescription for omeprazole and Colace. Discussed using over-the-counter methods like Gas-X to help with her bloating. I see no need for further imaging at this time. I have low concern for acute intra-abdominal pathology at this time. I advised the patient to follow-up with their primary care provider this week. I advised the patient to return to the emergency department with new or worsening symptoms or new concerns. The patient verbalized understanding and agreement with plan.    Results for orders placed or performed during the hospital encounter of 11/16/16  Lipase, blood  Result Value Ref Range   Lipase 25 11 - 51 U/L  Comprehensive metabolic panel  Result Value Ref Range   Sodium 141 135 - 145 mmol/L   Potassium 4.0 3.5 - 5.1 mmol/L   Chloride 107 101 - 111 mmol/L   CO2 29 22 - 32 mmol/L   Glucose, Bld 77 65 - 99 mg/dL   BUN 5 (L) 6 - 20 mg/dL   Creatinine, Ser 0.63 0.44 - 1.00 mg/dL   Calcium 9.3 8.9 - 10.3 mg/dL   Total Protein 7.1 6.5 - 8.1 g/dL   Albumin 4.1 3.5 - 5.0 g/dL   AST 14 (L) 15 - 41 U/L   ALT 15 14 - 54 U/L   Alkaline Phosphatase 47 38 - 126 U/L   Total Bilirubin 0.9 0.3 - 1.2 mg/dL   GFR calc non Af Amer >60 >60 mL/min   GFR calc Af Amer >60 >60 mL/min   Anion gap 5 5 - 15  CBC  Result Value Ref Range   WBC 8.1 4.0 -  10.5 K/uL   RBC 4.41 3.87 - 5.11 MIL/uL   Hemoglobin 12.1 12.0 - 15.0 g/dL   HCT 37.1 36.0 - 46.0 %   MCV 84.1 78.0 - 100.0 fL   MCH 27.4 26.0 - 34.0 pg   MCHC 32.6 30.0 - 36.0 g/dL   RDW 14.4 11.5 - 15.5 %   Platelets 202 150 - 400 K/uL  Urinalysis, Routine w reflex microscopic  Result Value Ref Range   Color, Urine YELLOW YELLOW   APPearance CLEAR CLEAR   Specific Gravity, Urine 1.004 (L) 1.005 - 1.030   pH 8.0 5.0 - 8.0   Glucose, UA NEGATIVE NEGATIVE mg/dL   Hgb urine dipstick SMALL (A) NEGATIVE   Bilirubin Urine NEGATIVE NEGATIVE   Ketones, ur NEGATIVE NEGATIVE mg/dL   Protein, ur NEGATIVE NEGATIVE mg/dL   Nitrite NEGATIVE NEGATIVE   Leukocytes, UA NEGATIVE NEGATIVE   RBC / HPF 0-5 0 - 5 RBC/hpf   WBC, UA NONE SEEN 0 - 5 WBC/hpf   Bacteria, UA NONE SEEN NONE SEEN   Squamous Epithelial / LPF 0-5 (A) NONE SEEN  I-Stat beta hCG blood, ED  Result Value Ref Range   I-stat hCG, quantitative <5.0 <5 mIU/mL   Comment 3  Waynetta Pean, PA-C 11/16/16 2053    Nat Christen, MD 11/19/16 [phone]

## 2016-11-16 NOTE — ED Triage Notes (Signed)
Pt states she has had abd pain and feeling a "knot" X3 weeks. Pt reports waves of nausea. Pt states the knot has moved from the RQ to closer to the umbilicus. Pt also reports some increased constipation.

## 2016-12-29 ENCOUNTER — Encounter: Payer: Self-pay | Admitting: Obstetrics & Gynecology

## 2016-12-29 ENCOUNTER — Ambulatory Visit (INDEPENDENT_AMBULATORY_CARE_PROVIDER_SITE_OTHER): Payer: Self-pay | Admitting: Obstetrics & Gynecology

## 2016-12-29 VITALS — BP 127/85 | HR 78 | Ht 65.0 in | Wt 186.4 lb

## 2016-12-29 DIAGNOSIS — N920 Excessive and frequent menstruation with regular cycle: Secondary | ICD-10-CM

## 2016-12-29 DIAGNOSIS — N946 Dysmenorrhea, unspecified: Secondary | ICD-10-CM

## 2016-12-29 MED ORDER — NORGESTIMATE-ETH ESTRADIOL 0.25-35 MG-MCG PO TABS: 1.0000 | ORAL_TABLET | Freq: Every day | ORAL | 2 refills | Status: DC

## 2016-12-29 NOTE — Progress Notes (Signed)
History:  31 y.o. [id number] here today for eval of pelvic pain. Pt got a Mirena 4 years prev. About 6 months prev pt began ot have heavier periods which she DID have prior to getting the Mirena. The pain is gettng worse and worse and now she has sx of constipation and intermittently diarrhea the sx are worse when she's on her menses.    wsThe following portions of the patient's history were reviewed and updated as appropriate: allergies, current medications, past family history, past medical history, past social history, past surgical history and problem list.  Review of Systems:  Pertinent items are noted in HPI.   Objective:  Physical Exam Blood pressure 127/85, pulse 78, height 5' 5"  (1.651 m), weight 186 lb 6.4 oz (84.6 kg), last menstrual period 12/14/2016. CONSTITUTIONAL: Well-developed, well-nourished female in no acute distress.  HENT:  Normocephalic, atraumatic EYES: Conjunctivae and EOM are normal. No scleral icterus.  NECK: Normal range of motion SKIN: Skin is warm and dry. No rash noted. Not diaphoretic.No pallor. [place]: Alert and oriented to person, place, and time. Normal coordination.  Abd: Soft, nontender and nondistended Pelvic: Normal appearing external genitalia; normal appearing vaginal mucosa and cervix.  Normal discharge.  Small uterus, no other palpable masses, no uterine or adnexal tenderness   Assessment & Plan:  Menorrhia and dysmenorrhea- I suspect her sx resolved with the LnIUD and as the device is in longer her sx are returning. Rec replacing the LnIUD.  With the assoc bowel sx I suspect that pt may have endometriosis.      Sprintec 1 po q day  Complete paperwork for grant for [place]  Will replace her IUD when it arrives.  Total face-to-face time with patient was 15 min.  Greater than 50% was spent in counseling and coordination of care with the patient.   Mayerli Kirst L. Harraway-Smith, M.D., Cherlynn June

## 2017-01-01 ENCOUNTER — Encounter: Payer: Self-pay | Admitting: Obstetrics & Gynecology

## 2017-05-01 ENCOUNTER — Encounter (HOSPITAL_COMMUNITY): Payer: Self-pay | Admitting: Emergency Medicine

## 2017-05-01 ENCOUNTER — Emergency Department (HOSPITAL_COMMUNITY)
Admission: EM | Admit: 2017-05-01 | Discharge: 2017-05-01 | Disposition: A | Discharge status: Home / Self Care | Payer: Self-pay | Attending: Emergency Medicine | Admitting: Emergency Medicine

## 2017-05-01 ENCOUNTER — Emergency Department (HOSPITAL_COMMUNITY): Payer: Self-pay

## 2017-05-01 DIAGNOSIS — N189 Chronic kidney disease, unspecified: Secondary | ICD-10-CM | POA: Insufficient documentation

## 2017-05-01 DIAGNOSIS — F17210 Nicotine dependence, cigarettes, uncomplicated: Secondary | ICD-10-CM | POA: Insufficient documentation

## 2017-05-01 DIAGNOSIS — J45909 Unspecified asthma, uncomplicated: Secondary | ICD-10-CM | POA: Insufficient documentation

## 2017-05-01 DIAGNOSIS — G8929 Other chronic pain: Secondary | ICD-10-CM

## 2017-05-01 DIAGNOSIS — R109 Unspecified abdominal pain: Secondary | ICD-10-CM | POA: Insufficient documentation

## 2017-05-01 DIAGNOSIS — Z79899 Other long term (current) drug therapy: Secondary | ICD-10-CM | POA: Insufficient documentation

## 2017-05-01 LAB — URINALYSIS, ROUTINE W REFLEX MICROSCOPIC
BACTERIA UA: NONE SEEN
Bilirubin Urine: NEGATIVE
Glucose, UA: NEGATIVE mg/dL
Ketones, ur: NEGATIVE mg/dL
Leukocytes, UA: NEGATIVE
NITRITE: NEGATIVE
Protein, ur: NEGATIVE mg/dL
SPECIFIC GRAVITY, URINE: 1.018 (ref 1.005–1.030)
pH: 6 (ref 5.0–8.0)

## 2017-05-01 LAB — COMPREHENSIVE METABOLIC PANEL
ALBUMIN: 4.3 g/dL (ref 3.5–5.0)
ALT: 19 U/L (ref 14–54)
AST: 23 U/L (ref 15–41)
Alkaline Phosphatase: 48 U/L (ref 38–126)
Anion gap: 7 (ref 5–15)
BILIRUBIN TOTAL: 0.5 mg/dL (ref 0.3–1.2)
BUN: 7 mg/dL (ref 6–20)
CO2: 26 mmol/L (ref 22–32)
Calcium: 8.9 mg/dL (ref 8.9–10.3)
Chloride: 106 mmol/L (ref 101–111)
Creatinine, Ser: 0.58 mg/dL (ref 0.44–1.00)
GFR calc Af Amer: >60 mL/min (ref >60)
GFR calc non Af Amer: >60 mL/min (ref >60)
GLUCOSE: 93 mg/dL (ref 65–99)
POTASSIUM: 3.6 mmol/L (ref 3.5–5.1)
Sodium: 139 mmol/L (ref 135–145)
TOTAL PROTEIN: 7.6 g/dL (ref 6.5–8.1)

## 2017-05-01 LAB — I-STAT BETA HCG BLOOD, ED (MC, WL, AP ONLY)

## 2017-05-01 LAB — LIPASE, BLOOD: Lipase: 24 U/L (ref 11–51)

## 2017-05-01 LAB — CBC
HEMATOCRIT: 36.8 % (ref 36.0–46.0)
Hemoglobin: 12.2 g/dL (ref 12.0–15.0)
MCH: 28 pg (ref 26.0–34.0)
MCHC: 33.2 g/dL (ref 30.0–36.0)
MCV: 84.4 fL (ref 78.0–100.0)
Platelets: 153 10*3/uL (ref 150–400)
RBC: 4.36 MIL/uL (ref 3.87–5.11)
RDW: 13.8 % (ref 11.5–15.5)
WBC: 2.7 10*3/uL — ABNORMAL LOW (ref 4.0–10.5)

## 2017-05-01 MED ORDER — PREDNISONE 10 MG PO TABS: 10.0000 mg | ORAL_TABLET | Freq: Every day | ORAL | 0 refills | Status: DC

## 2017-05-01 MED ORDER — IOPAMIDOL (ISOVUE-300) INJECTION 61%
INTRAVENOUS | Status: AC
  Administered 2017-05-01: 100 mL
  Filled 2017-05-01: qty 100

## 2017-05-01 NOTE — Discharge Instructions (Addendum)
CT scan showed swelling of your small intestine. This could mean inflammatory bowel disease like Crohn's.   You need follow up with gastroenterology within 1-2 weeks. A colonoscopy and biopsy needs to be done to determine true diagnosis.  Take prednisone taper as prescribed. Avoid ibuprofen, aleve, advil, alcohol as this will cause more inflammation.   Return if fevers, vomiting, blood in stool, fevers, worsening abdominal pain.

## 2017-05-01 NOTE — ED Triage Notes (Signed)
Patient c/o "lump around belly button" x1 year. Denies heavy lifting. Reports previous dx of constipation. Denies N/V/D.

## 2017-05-01 NOTE — ED Provider Notes (Addendum)
[facility] DEPT Provider Note   CSN: [id number] Arrival date & time: 05/01/17  1141     History   Chief Complaint Chief Complaint  Patient presents with  . Abdominal Pain    HPI Jocelyn Garcia is a 32 y.o. female.  The history is provided by the patient. No language interpreter was used.  Abdominal Pain   This is a new problem. The problem occurs constantly. The problem has been gradually worsening. The pain is associated with an unknown factor. The pain is located in the RUQ and epigastric region. The quality of the pain is aching.  Pt reports her upper abdomen feels swollen and she feels like she has a knot inside.  Pt reports she has had discmfort for a year but pain is worsening.  Pt reports she has lost 18 pounds in the last 6 months.  Pt reports she is not trying to lose weight.   Past Medical History:  Diagnosis Date  . Anemia   . Anxiety   . Asthma    exercise induced  . Chronic kidney disease   . Headache   . Kidney stones   . Urinary tract infection     Patient Active Problem List   Diagnosis Date Noted  . IUD check up 02/22/2013  . Normal delivery 12/08/2012  . Post term pregnancy, delivered 12/08/2012  . Anemia complicating pregnancy 41/96/2229  . Insufficient prenatal care in third trimester 10/10/2012  . Short interval between pregnancies complicating pregnancy, antepartum 10/10/2012  . Supervision of other normal pregnancy 09/25/2012  . Obesity complicating pregnancy 79/89/2119  . Late prenatal care 09/25/2012    Past Surgical History:  Procedure Laterality Date  . NO PAST SURGERIES    . WISDOM TOOTH EXTRACTION      OB History    Gravida Para Term Preterm AB Living   2 2 2     2    SAB TAB Ectopic Multiple Live Births           2       Home Medications    Prior to Admission medications   Medication Sig Start Date End Date Taking? Authorizing Provider  acetaminophen (TYLENOL) 500 MG tablet Take 500 mg  by mouth every 6 (six) hours as needed for moderate pain.   Yes [provider]  ferrous sulfate 325 (65 FE) MG tablet Take 325 mg by mouth daily with breakfast.   Yes [provider]  levonorgestrel (MIRENA) 20 MCG/24HR IUD 1 each by Intrauterine route once.   Yes [provider]  naproxen sodium (ALEVE) 220 MG tablet Take 220 mg by mouth daily as needed (pain).   Yes [provider]  docusate sodium (COLACE) 100 MG capsule Take 1 capsule (100 mg total) by mouth every 12 (twelve) hours. Patient not taking: Reported on 05/01/2017 11/16/16   Glyn Ade, PA-C  ibuprofen (ADVIL,MOTRIN) 600 MG tablet Take 1 tablet (600 mg total) by mouth every 6 (six) hours as needed. Patient not taking: Reported on 05/01/2017 09/14/16   Rasch, Anderson Malta I, NP  norgestimate-ethinyl estradiol (ORTHO-CYCLEN,SPRINTEC,PREVIFEM) 0.25-35 MG-MCG tablet Take 1 tablet by mouth daily. Patient not taking: Reported on 05/01/2017 12/29/16   Lavonia Drafts, MD  omeprazole (PRILOSEC) 20 MG capsule Take 1 capsule (20 mg total) by mouth daily. Patient not taking: Reported on 05/01/2017 11/16/16   Glyn Ade, PA-C  ondansetron (ZOFRAN) 4 MG tablet Take 1 tablet (4 mg total) by mouth every 8 (eight) hours as  needed for nausea or vomiting. Patient not taking: Reported on 12/29/2016 11/16/16   Glyn Ade, PA-C  simethicone (GAS-X) 80 MG chewable tablet Chew 1 tablet (80 mg total) by mouth every 6 (six) hours as needed for flatulence. Patient not taking: Reported on 05/01/2017 11/16/16   Glyn Ade, PA-C    Family History Family History  Problem Relation Age of Onset  . Heart disease Maternal Grandmother   . Stroke Maternal Grandmother   . Hypertension Maternal Grandmother   . Cancer Maternal Grandfather   . Heart disease Mother   . Hypertension Mother   . Diabetes Mother   . Endometriosis Mother   . Kidney disease Mother        stagae 3 renal failure  . Cancer  Paternal Grandmother   . Anesthesia problems Neg Hx     Social History Social History   Tobacco Use  . Smoking status: Current Every Day Smoker    Packs/day: 0.50    Years: 10.00    Pack years: 5.00    Types: Cigarettes  . Smokeless tobacco: Never Used  Substance Use Topics  . Alcohol use: No  . Drug use: No    Comment: over a year ago     Allergies   Patient has no known allergies.   Review of Systems Review of Systems  Gastrointestinal: Positive for abdominal pain.  All other systems reviewed and are negative.    Physical Exam Updated Vital Signs BP 134/89   Pulse 83   Temp 98.3 F (36.8 C) (Oral)   Resp 16   Ht 5' 5"  (1.651 m)   Wt 79.8 kg (176 lb)   SpO2 100%   BMI 29.29 kg/m   Physical Exam  Constitutional: She appears well-developed and well-nourished. No distress.  HENT:  Head: Normocephalic and atraumatic.  Eyes: Conjunctivae are normal.  Neck: Neck supple.  Cardiovascular: Normal rate and regular rhythm.  No murmur heard. Pulmonary/Chest: Effort normal and breath sounds normal. No respiratory distress.  Abdominal: Soft. There is tenderness in the right upper quadrant. There is guarding. There is no CVA tenderness. No hernia.  Musculoskeletal: She exhibits no edema.  Neurological: She is alert.  Skin: Skin is warm and dry.  Psychiatric: She has a normal mood and affect.  Nursing note and vitals reviewed.    ED Treatments / Results  Labs (all labs ordered are listed, but only abnormal results are displayed) Labs Reviewed  CBC - Abnormal; Notable for the following components:      Result Value   WBC 2.7 (*)    All other components within normal limits  URINALYSIS, ROUTINE W REFLEX MICROSCOPIC - Abnormal; Notable for the following components:   Hgb urine dipstick MODERATE (*)    Squamous Epithelial / LPF 0-5 (*)    All other components within normal limits  LIPASE, BLOOD  COMPREHENSIVE METABOLIC PANEL  I-STAT BETA HCG BLOOD, ED ([facility],  [facility], [facility] ONLY)    EKG  EKG Interpretation None       Radiology No results found.  Procedures Procedures (including critical care time)  Medications Ordered in ED Medications - No data to display   Initial Impression / Assessment and Plan / ED Course  I have reviewed the triage vital signs and the nursing notes.  Pertinent labs & imaging results that were available during my care of the patient were reviewed by me and considered in my medical decision making (see chart for details).  Clinical Course as of  May 02 820  Mon May 01, 2017  1808 IMPRESSION: 1. Free pelvic fluid and probable right corpus luteum cyst. Recommend correlation with pelvic ultrasound. 2. Enhancement of the mucosa of the terminal ileum, suspicious for inflammatory bowel disease such as Crohn's. Correlation with history is indicated. 3. Normal appendix. CT Abdomen Pelvis W Contrast [CG]    Clinical Course User Index [CG] Kinnie Feil, PA-C    Ct scan pending.  Pt's care turned over to Parnell PA at [facility] pending   Final Clinical Impressions(s) / ED Diagnoses   Final diagnoses:  Chronic abdominal pain    ED Discharge Orders        Ordered    predniSONE (DELTASONE) 10 MG tablet  Daily     05/01/17 1935       Sidney Ace 05/01/17 1552    Lacretia Leigh, MD 05/01/17 1621    Fransico Meadow, PA-C 05/02/17 9741    Lacretia Leigh, MD 05/02/17 1036

## 2017-05-01 NOTE — ED Provider Notes (Signed)
Patient handed off to me by previous ED. Shift change pending CT scan and reassessment. Please see previous note for full details. Briefly, patient is a 32 year old female who is here for chronic epigastric/periumbilical abdominal pain for one year. Associated with changes in bowel movements including constipation and diarrhea intermittently, increased belching, gas and unexpected 18 pound weight loss in the last 3-4 months. No fevers, chills, melena, hematochezia. No known GI problems in the past. No history of PUD, heavy EtOH/NSAID use.  On my exam, patient has mild periumbilical/epigastric abdominal tenderness. No distention or peritonitis. No suprapubic or CVA tenderness. Lab work today unremarkable. CT shows marked edema of the terminal ileum mucosa raising suspicion for inflammatory bowel disease. Patient has no insurance, primary care doctor or GI follow-up. We'll consult GI for further recommendations.  1850: Spoke to Dr Carlean Purl with [facility] who recommends f/u in 1-2 weeks for colonoscopy and biopsy to confirm IBD. Will d/c with prednisone taper for therapeutic trial. Appreciate Dr Carlean Purl assistance with this patient. Pt given specific instructions on f/u with GI needed. She is agreeable. Discharged with VS WNL.    Kinnie Feil, PA-C 05/01/17 1851    Lacretia Leigh, MD 05/02/17 [phone]

## 2017-05-01 NOTE — ED Notes (Signed)
PA at bedside.

## 2017-05-02 ENCOUNTER — Telehealth: Payer: Self-pay

## 2017-05-02 NOTE — Progress Notes (Signed)
Patient needs appt any MD re: suspected Crohn's disease

## 2017-05-02 NOTE — Telephone Encounter (Signed)
-----   Message from Gatha Mayer, MD sent at 05/02/2017  4:12 PM EST -----   ----- Message ----- From: Arlean Hopping Sent: 05/01/2017   6:53 PM To: Gatha Mayer, MD  Dr. Carlean Purl,   This is the patient in ED we spoke about on 05/01/17. CT AP shows marked mucosal edema of terminal ileum concerning for IBD. She has had abdominal pain x 1 year. She will need follow up for colonoscopy and biopsy. Thank you for your help with this patient.   Carmon Sails, PA-C [facility]

## 2017-05-02 NOTE — Telephone Encounter (Signed)
Gatha Mayer, MD sent to Marlon Pel, RN      Previous Messages    ----- Message -----  From: Arlean Hopping  Sent: 05/01/2017  6:53 PM  To: Gatha Mayer, MD   Dr. Carlean Purl,   This is the patient in ED we spoke about on 05/01/17. CT AP shows marked mucosal edema of terminal ileum concerning for IBD. She has had abdominal pain x 1 year. She will need follow up for colonoscopy and biopsy. Thank you for your help with this patient.   Carmon Sails, PA-C  [facility]    Routed Note   Author: Gatha Mayer, MD Service: Gastroenterology Author Type: Physician  Filed: 05/02/2017 4:12 PM Date of Service: 05/01/2017 7:55 PM Status: Signed  Editor: Gatha Mayer, MD (Physician)       [] Hide copied text  [] Hover for details   Patient needs appt any MD re: suspected Crohn's disease         Patient has been scheduled with Dr. Carlean Purl first MD opening with all.  She is asked to arrive at 8:15 05/10/17

## 2017-05-10 ENCOUNTER — Ambulatory Visit (INDEPENDENT_AMBULATORY_CARE_PROVIDER_SITE_OTHER): Payer: Self-pay | Admitting: Internal Medicine

## 2017-05-10 ENCOUNTER — Encounter: Payer: Self-pay | Admitting: Internal Medicine

## 2017-05-10 VITALS — BP 136/84 | HR 68 | Ht 65.0 in | Wt 183.5 lb

## 2017-05-10 DIAGNOSIS — K50012 Crohn's disease of small intestine with intestinal obstruction: Secondary | ICD-10-CM

## 2017-05-10 DIAGNOSIS — R3129 Other microscopic hematuria: Secondary | ICD-10-CM

## 2017-05-10 DIAGNOSIS — F17200 Nicotine dependence, unspecified, uncomplicated: Secondary | ICD-10-CM

## 2017-05-10 NOTE — Patient Instructions (Addendum)
  Please MyChart [place] on Monday February 11th with an update on how your doing.   We are giving you a handout to read on Crohn's and also a complimentary [employer] membership application form.   Please follow up with [place] 06/26/17 at 9:00AM.    I appreciate the opportunity to care for you. Silvano Rusk, MD, [facility]

## 2017-05-10 NOTE — Progress Notes (Signed)
Jocelyn Garcia 32 y.o. 1985-04-28 [id number]  Assessment & Plan:   Encounter Diagnoses  Name Primary?  . Crohn's disease of ileum with intestinal obstruction ([place]) Yes  . Smoker   . Hematuria, microscopic     The available data and history suggests he has Crohn's disease.  A colonoscopy would be best to help confirm this diagnosis.  She is uninsured and would like to avoid incurring further medical that if she can right now.  She plans to apply for Medicaid.  She is to call me in about 5 days with an update on her symptoms, I explained how treating with prednisone is not ideal but we can do that for a time, consider getting biologic therapy through samples if possible or a program for financially distressed persons, pending clinical course.  Anticipate follow-up in about 2 months, sooner if needed.  We will work through the phone or my chart try to manage this right now.  I have given her a handout about Crohn's disease and membership to the Crohn's and colitis foundation of [place], and explained the nature of Crohn's disease as best I can.  She was encouraged to decrease and quit smoking as that can substantially improve her symptomatology from Crohn's.  She understands this is a working diagnosis.  Hematuria could be related to menorrhagia - saw GYN 12/2016 and was to have IUD replaced  Subjective:   Chief Complaint: Abdominal pain, CT scan suggesting Crohn's disease  HPI The patient is a 32 year old single white woman with a history of chronic recurrent abdominal pain and alternating bowel habits.  She was seen in the emergency department on January 28 with these problems, she was tender in the right lower quadrant quadrant and periumbilical areas and a CT scan showed inflammatory changes and enhancement of the terminal ileum.  I spoke to the provider on call and recommended a prednisone taper, she is currently on day the 30 mg daily, having started at 40 mg a day for 3 days.  She  had to wait a few days before she had money to buy her prescription.  She does not have health insurance.  Makes healthcare access difficult.  She does feel like the prednisone is starting to relieve the pain and she is passing gas and having more frequent bowel movements.  No bleeding.  No fevers.  Comprehensive metabolic panel was normal, her hemoglobin was normal her white count was 2.7 otherwise normal CBC.  HCG was negative and the urinalysis showed TNTC red cells, otherwise negative No Known Allergies Current Meds  Medication Sig  . ferrous sulfate 325 (65 FE) MG tablet Take 325 mg by mouth daily with breakfast.  . levonorgestrel (MIRENA) 20 MCG/24HR IUD 1 each by Intrauterine route once.  . predniSONE (DELTASONE) 10 MG tablet Take 1 tablet (10 mg total) by mouth daily. Take 40 mg for 3 days, 30 mg for 3 days, 20 mg for 3 days, 15 mg for 3 days, 34m for 3 days and 550mfor 3 days.   Past Medical History:  Diagnosis Date  . Anemia   . Anxiety   . Asthma    exercise induced  . Kidney stones   . Migraines   . Urinary tract infection    Past Surgical History:  Procedure Laterality Date  . WISDOM TOOTH EXTRACTION     Social History   Social History Narrative   She is single, 2 daughters one born in 2014 1 born in 2013 the oldest  is in kindergarten.  2 caffeinated beverages daily and she does smoke a half a pack a day.  Daughter's father is incarcerated.\   Currently uninsured   05/10/2017   family history includes Cancer in her maternal grandfather and paternal grandmother; Clotting disorder in her maternal grandmother; Diabetes in her mother; Endometriosis in her mother; Heart disease in her maternal grandmother and mother; Hypertension in her maternal grandmother and mother; Kidney disease in her mother; Stroke in her maternal grandmother.   Review of Systems The past year she had several bad headaches, with nausea and vomiting she questions migraines, mild pedal edema, she has  painful menstrual periods and some low back pain.  Objective:   Physical Exam @BP  136/84 (BP Location: Left Arm, Patient Position: Sitting, Cuff Size: Normal)   Pulse 68   Ht 5' 5"  (1.651 m) Comment: height measured without shoes  Wt 183 lb 8 oz (83.2 kg)   BMI 30.54 kg/m @  General:  Well-developed, well-nourished and in no acute distress Eyes:  anicteric. ENT:   Mouth and posterior pharynx free of lesions. Dental caries, fillings, missing several teeth Neck:   supple w/o thyromegaly or mass.  Lungs: Clear to auscultation bilaterally. Heart:  S1S2, no rubs, murmurs, gallops. Abdomen:  soft, non-tender, no hepatosplenomegaly, hernia, or mass and BS+.  Lymph:  no cervical or supraclavicular adenopathy. Extremities:   no edema, cyanosis or clubbing Skin   no rash. Neuro:  A&O x 3.  Psych:  appropriate mood and  Affect.   Data Reviewed: See HPI

## 2017-05-15 ENCOUNTER — Encounter: Payer: Self-pay | Admitting: Internal Medicine

## 2017-05-16 MED ORDER — DICYCLOMINE HCL 20 MG PO TABS: 20.0000 mg | ORAL_TABLET | Freq: Four times a day (QID) | ORAL | 1 refills | Status: DC | PRN

## 2017-05-16 MED ORDER — PREDNISONE 10 MG PO TABS: 40.0000 mg | ORAL_TABLET | Freq: Every day | ORAL | 0 refills | Status: DC

## 2017-05-16 NOTE — Telephone Encounter (Signed)
I called her  No fever  Will try prednisone 40 mg qd x 1 week and then she is to update me - call back sooner prn Dicyclomine for cramps  Rxs odered

## 2017-05-23 ENCOUNTER — Encounter: Payer: Self-pay | Admitting: Internal Medicine

## 2017-05-29 ENCOUNTER — Encounter: Payer: Self-pay | Admitting: Internal Medicine

## 2017-06-02 ENCOUNTER — Encounter: Payer: Self-pay | Admitting: Internal Medicine

## 2017-06-16 ENCOUNTER — Encounter: Payer: Self-pay | Admitting: Internal Medicine

## 2017-06-16 ENCOUNTER — Other Ambulatory Visit: Payer: Self-pay | Admitting: Internal Medicine

## 2017-06-16 MED ORDER — PREDNISONE 5 MG PO TABS: 10.0000 mg | ORAL_TABLET | Freq: Every day | ORAL | 0 refills | Status: DC

## 2017-06-26 ENCOUNTER — Ambulatory Visit (INDEPENDENT_AMBULATORY_CARE_PROVIDER_SITE_OTHER): Payer: Self-pay | Admitting: Internal Medicine

## 2017-06-26 ENCOUNTER — Encounter: Payer: Self-pay | Admitting: Internal Medicine

## 2017-06-26 ENCOUNTER — Telehealth: Payer: Self-pay

## 2017-06-26 DIAGNOSIS — K50012 Crohn's disease of small intestine with intestinal obstruction: Secondary | ICD-10-CM

## 2017-06-26 HISTORY — DX: Crohn's disease of small intestine with intestinal obstruction: K50.012

## 2017-06-26 NOTE — Progress Notes (Signed)
Jocelyn Garcia 32 y.o. Mar 12, 1986 [id number]  Assessment & Plan:   Encounter Diagnosis  Name Primary?  . Crohn's disease of small intestine with intestinal obstruction ([place])     She has better on steroids.  While it would be ideal to have a colonoscopy to confirm her diagnosis her response and imaging to date suggest that that is what it is.  Biologic therapy seems like the next step.  We talked about that today and in thinking through that a bit more I am reluctant to start that until I have a colonoscopy to confirm her diagnosis.  She wants to try to get her Medicaid to help pay for things prior to having the colonoscopy.  I think that is okay given her response.  I also mentioned that surgical resection might be a treatment option that is needed particularly if there is more scarring rather than inflammatory stricture.  We will sort through this as we go forward.  She will remain on prednisone 10 mg daily she will return in about 2-3 months.  In the meantime if she is worsening she will update me.  I wrote her a letter in support of her Medicaid approval today.  She will let me know when she obtains that if she does.  At that point I would schedule a colonoscopy.  We will monitor her blood pressure is mildly elevated today, certainly the steroids may have had something to do with that.  She will continue her dicyclomine as needed also.  The other option will be to apply for an assistance program for the medication.  Again would prefer to have a colonoscopy confirming my diagnosis to starting biologic therapy.  She has had some constipation and I think MiraLAX might help and have recommended this to her.  Subjective:   Chief Complaint: Follow-up of Crohn's disease  HPI Jocelyn Garcia is here today by herself, I met her on May 10, 2017 and Crohn's disease of the distal ileum was diagnosed at that time based upon CT findings and symptoms.  Started on prednisone had improvement as we  tapered things flared some so I went back up and now she is tapered down again to about 10 mg daily and is definitely better than her first visit with me and when she was in the emergency department at which point she had a CT scan that showed a terminal ileitis.  She still has some intermittent cramping and feels a little bit constipated.  She is in the process of applying for Medicaid.  Wt Readings from Last 3 Encounters:  06/26/17 186 lb 8 oz (84.6 kg)  05/10/17 183 lb 8 oz (83.2 kg)  05/01/17 176 lb (79.8 kg)    No Known Allergies Current Meds  Medication Sig  . acetaminophen (TYLENOL) 500 MG tablet Take 500 mg by mouth every 6 (six) hours as needed for moderate pain.  Marland Kitchen dicyclomine (BENTYL) 20 MG tablet Take 1 tablet (20 mg total) by mouth every 6 (six) hours as needed for spasms.  . ferrous sulfate 325 (65 FE) MG tablet Take 325 mg by mouth daily with breakfast.  . levonorgestrel (MIRENA) 20 MCG/24HR IUD 1 each by Intrauterine route once.  . predniSONE (DELTASONE) 5 MG tablet Take 2 tablets (10 mg total) by mouth daily with breakfast.   Past Medical History:  Diagnosis Date  . Anemia   . Anxiety   . Asthma    exercise induced  . Kidney stones   . Migraines   .  Urinary tract infection    Past Surgical History:  Procedure Laterality Date  . WISDOM TOOTH EXTRACTION     Social History   Social History Narrative   She is single, 2 daughters one born in 2014 1 born in 2013 the oldest is in kindergarten.  2 caffeinated beverages daily and she does smoke a half a pack a day.  Daughter's father is incarcerated.\   Currently uninsured   05/10/2017   family history includes Cancer in her maternal grandfather and paternal grandmother; Clotting disorder in her maternal grandmother; Diabetes in her mother; Endometriosis in her mother; Heart disease in her maternal grandmother and mother; Hypertension in her maternal grandmother and mother; Kidney disease in her mother; Stroke in her  maternal grandmother.   Review of Systems As per HPI  Objective:   Physical Exam BP (!) 140/100 (BP Location: Left Arm, Patient Position: Sitting, Cuff Size: Normal)   Pulse 80   Ht 5' 5"  (1.651 m)   Wt 186 lb 8 oz (84.6 kg)   BMI 31.04 kg/m  Abdomen is soft without mass there is mild right lower quadrant tenderness

## 2017-06-26 NOTE — Assessment & Plan Note (Signed)
Improved Lack of insurance still complicating things

## 2017-06-26 NOTE — Patient Instructions (Addendum)
  Keep working on Manufacturing systems engineer.  Let us know when that comes thru.   Stay on the medicines your on currently.    We will see you back on 08/21/17.   We are providing you with a letter to hopefully help you get Medicaid.    I appreciate the opportunity to care for you. Silvano Rusk, MD, [facility]

## 2017-06-26 NOTE — Telephone Encounter (Signed)
-----   Message from Gatha Mayer, MD sent at 06/26/2017  4:51 PM EDT ----- Regarding: miralax Please call her when you get a chance, I forgot to remind you to tell her to take a dose of MiraLAX daily to help with constipation

## 2017-06-26 NOTE — Telephone Encounter (Signed)
Left patient detailed message regarding the use of the miralax and told her to call me back with any questions.

## 2017-07-13 ENCOUNTER — Encounter: Payer: Self-pay | Admitting: Internal Medicine

## 2017-07-14 ENCOUNTER — Other Ambulatory Visit: Payer: Self-pay | Admitting: Internal Medicine

## 2017-07-14 MED ORDER — PREDNISONE 5 MG PO TABS: 10.0000 mg | ORAL_TABLET | Freq: Every day | ORAL | 2 refills | Status: DC

## 2017-07-14 MED ORDER — NYSTATIN 100000 UNIT/ML MT SUSP: 5.0000 mL | Freq: Four times a day (QID) | OROMUCOSAL | 1 refills | Status: DC

## 2017-08-21 ENCOUNTER — Encounter: Payer: Self-pay | Admitting: Internal Medicine

## 2017-08-21 ENCOUNTER — Ambulatory Visit (INDEPENDENT_AMBULATORY_CARE_PROVIDER_SITE_OTHER): Payer: Self-pay | Admitting: Internal Medicine

## 2017-08-21 VITALS — BP 150/100 | HR 76 | Ht 65.0 in | Wt 185.0 lb

## 2017-08-21 DIAGNOSIS — K50012 Crohn's disease of small intestine with intestinal obstruction: Secondary | ICD-10-CM

## 2017-08-21 MED ORDER — PREDNISONE 10 MG PO TABS: 20.0000 mg | ORAL_TABLET | Freq: Every day | ORAL | 1 refills | Status: DC

## 2017-08-21 MED ORDER — PEG-KCL-NACL-NASULF-NA ASC-C 140 G PO SOLR: 1.0000 | Freq: Once | ORAL | 0 refills | Status: AC

## 2017-08-21 MED ORDER — DICYCLOMINE HCL 20 MG PO TABS: 20.0000 mg | ORAL_TABLET | Freq: Four times a day (QID) | ORAL | 0 refills | Status: DC | PRN

## 2017-08-21 NOTE — Progress Notes (Signed)
   Jocelyn Garcia 32 y.o. Feb 18, 1986 [id number]  Assessment & Plan:    Encounter Diagnosis  Name Primary?  . Crohn's disease of small intestine with intestinal obstruction ([place]) Yes    Still w/ sxs  Evaluate w/ colonoscopy The risks and benefits as well as alternatives of endoscopic procedure(s) have been discussed and reviewed. All questions answered. The patient agrees to proceed.   Increase prednisone to 20 mg qd Subjective:   Chief Complaint: abd pain, feet swelling  HPI  Jocelyn Garcia here with persistent intermittenr RLQ pain, some diarrhea On prednisone 10 mg a day for working dx Crohn's disease Wt Readings from Last 3 Encounters:  08/21/17 185 lb (83.9 kg)  06/26/17 186 lb 8 oz (84.6 kg)  05/10/17 183 lb 8 oz (83.2 kg)    No Known Allergies Current Meds  Medication Sig  . acetaminophen (TYLENOL) 500 MG tablet Take 500 mg by mouth every 6 (six) hours as needed for moderate pain.  Marland Kitchen dicyclomine (BENTYL) 20 MG tablet Take 1 tablet (20 mg total) by mouth every 6 (six) hours as needed for spasms.  . ferrous sulfate 325 (65 FE) MG tablet Take 325 mg by mouth daily with breakfast.  . levonorgestrel (MIRENA) 20 MCG/24HR IUD 1 each by Intrauterine route once.  . nystatin (MYCOSTATIN) 100000 UNIT/ML suspension Take 5 mLs (500,000 Units total) by mouth 4 (four) times daily. Swish and swallow  . predniSONE (DELTASONE) 5 MG tablet Take 2 tablets (10 mg total) by mouth daily with breakfast.   Past Medical History:  Diagnosis Date  . Anemia   . Anxiety   . Asthma    exercise induced  . Crohn's disease of small intestine with intestinal obstruction ([place]) 06/26/2017  . Kidney stones   . Migraines   . Urinary tract infection    Past Surgical History:  Procedure Laterality Date  . WISDOM TOOTH EXTRACTION     Social History   Social History Narrative   She is single, 2 daughters one born in 2014 1 born in 2013 the oldest is in kindergarten.  2 caffeinated beverages daily  and she does smoke a half a pack a day.  Daughter's father is incarcerated.\   Currently uninsured   05/10/2017   family history includes Cancer in her maternal grandfather and paternal grandmother; Clotting disorder in her maternal grandmother; Diabetes in her mother; Endometriosis in her mother; Heart disease in her maternal grandmother and mother; Hypertension in her maternal grandmother and mother; Kidney disease in her mother; Stroke in her maternal grandmother.   Review of Systems   Objective:   Physical Exam BP (!) 150/100 (BP Location: Left Arm, Patient Position: Sitting, Cuff Size: Normal)   Pulse 76   Ht 5' 5"  (1.651 m)   Wt 185 lb (83.9 kg)   BMI 30.79 kg/m  NAD abd soft, mild RLQ tenderness

## 2017-08-21 NOTE — Patient Instructions (Signed)
You have been scheduled for a colonoscopy. Please follow written instructions given to you at your visit today.  Please use the Plenvu prep kit we have given you today. If you use inhalers (even only as needed), please bring them with you on the day of your procedure.   We have sent the following medications to your pharmacy for you to pick up at your convenience: Prednisone, dicyclomine     I appreciate the opportunity to care for you. Silvano Rusk, MD, [facility]

## 2017-08-24 ENCOUNTER — Other Ambulatory Visit: Payer: Self-pay

## 2017-08-24 ENCOUNTER — Ambulatory Visit (AMBULATORY_SURGERY_CENTER): Payer: Self-pay | Admitting: Internal Medicine

## 2017-08-24 ENCOUNTER — Encounter: Payer: Self-pay | Admitting: Internal Medicine

## 2017-08-24 VITALS — BP 140/88 | HR 55 | Temp 98.4°F | Resp 9 | Ht 65.0 in | Wt 185.0 lb

## 2017-08-24 DIAGNOSIS — K5000 Crohn's disease of small intestine without complications: Secondary | ICD-10-CM

## 2017-08-24 DIAGNOSIS — K5010 Crohn's disease of large intestine without complications: Secondary | ICD-10-CM

## 2017-08-24 DIAGNOSIS — D122 Benign neoplasm of ascending colon: Secondary | ICD-10-CM

## 2017-08-24 MED ORDER — PREDNISONE 10 MG PO TABS: 10.0000 mg | ORAL_TABLET | Freq: Every day | ORAL | 1 refills | Status: DC

## 2017-08-24 MED ORDER — SODIUM CHLORIDE 0.9 % IV SOLN: 500.0000 mL | Freq: Once | INTRAVENOUS | Status: DC

## 2017-08-24 NOTE — Op Note (Signed)
[facility] Patient Name: Jocelyn Garcia Procedure Date: 08/24/2017 9:56 AM MRN: [id number] Endoscopist: Gatha Mayer , MD Age: 32 Referring MD:  Date of Birth: 1985/08/14 Gender: Female Account #: [id number] Procedure:                Colonoscopy Indications:              Abnormal CT of the GI tract, Suspected Crohn's                            disease of the small bowel Medicines:                Propofol per Anesthesia, Monitored Anesthesia Care Procedure:                Pre-Anesthesia Assessment:                           - Prior to the procedure, a History and Physical                            was performed, and patient medications and                            allergies were reviewed. The patient's tolerance of                            previous anesthesia was also reviewed. The risks                            and benefits of the procedure and the sedation                            options and risks were discussed with the patient.                            All questions were answered, and informed consent                            was obtained. Prior Anticoagulants: The patient has                            taken no previous anticoagulant or antiplatelet                            agents. ASA Grade Assessment: II - A patient with                            mild systemic disease. After reviewing the risks                            and benefits, the patient was deemed in                            satisfactory condition to undergo the procedure.  After obtaining informed consent, the colonoscope                            was passed under direct vision. Throughout the                            procedure, the patient's blood pressure, pulse, and                            oxygen saturations were monitored continuously. The                            Colonoscope was introduced through the anus and                            advanced to  the the terminal ileum, with                            identification of the appendiceal orifice and IC                            valve. The colonoscopy was performed without                            difficulty. The patient tolerated the procedure                            well. The quality of the bowel preparation was                            good. The terminal ileum, ileocecal valve,                            appendiceal orifice, and rectum were photographed.                            The bowel preparation used was Miralax. Scope In: 10:02:05 AM Scope Out: 10:14:37 AM Scope Withdrawal Time: 0 hours 9 minutes 45 seconds  Total Procedure Duration: 0 hours 12 minutes 32 seconds  Findings:                 The perianal and digital rectal examinations were                            normal.                           The terminal ileum appeared normal. Biopsies were                            taken with a cold forceps for histology.                            Verification of patient identification for the  specimen was done. Estimated blood loss was minimal.                           The entire examined colon appeared normal on direct                            and retroflexion views. Complications:            No immediate complications. Estimated Blood Loss:     Estimated blood loss was minimal. Impression:               - The examined portion of the ileum was normal.                            Biopsied.                           - The entire examined colon is normal on direct and                            retroflexion views.                           She is on prednisone 20 mg daily for suspected                            Crohn's Recommendation:           - Patient has a contact number available for                            emergencies. The signs and symptoms of potential                            delayed complications were discussed with the                             patient. Return to normal activities tomorrow.                            Written discharge instructions were provided to the                            patient.                           - Resume previous diet.                           - Continue present medications.                           - Await pathology results.                           - No recommendation at this time regarding repeat  colonoscopy due to young age. Gatha Mayer, MD 08/24/2017 10:20:34 AM This report has been signed electronically.

## 2017-08-24 NOTE — Progress Notes (Signed)
Called to room to assist during endoscopic procedure.  Patient ID and intended procedure confirmed with present staff. Received instructions for my participation in the procedure from the performing physician.  

## 2017-08-24 NOTE — Patient Instructions (Addendum)
I did not see any inflammation.  DECREASE PREDNISONE to 10 mg per day by mouth.  ? Of some possible scarring in the small intestine - I took biopsies.  Overall things look good.  Maybe you do not have Crohn's disease!  I will let you know  I appreciate the opportunity to care for you. Gatha Mayer, MD, FACG    YOU HAD AN ENDOSCOPIC PROCEDURE TODAY AT [facility]:   Refer to the procedure report that was given to you for any specific questions about what was found during the examination.  If the procedure report does not answer your questions, please call your gastroenterologist to clarify.  If you requested that your care partner not be given the details of your procedure findings, then the procedure report has been included in a sealed envelope for you to review at your convenience later.  YOU SHOULD EXPECT: Some feelings of bloating in the abdomen. Passage of more gas than usual.  Walking can help get rid of the air that was put into your GI tract during the procedure and reduce the bloating. If you had a lower endoscopy (such as a colonoscopy or flexible sigmoidoscopy) you may notice spotting of blood in your stool or on the toilet paper. If you underwent a bowel prep for your procedure, you may not have a normal bowel movement for a few days.  Please Note:  You might notice some irritation and congestion in your nose or some drainage.  This is from the oxygen used during your procedure.  There is no need for concern and it should clear up in a day or so.  SYMPTOMS TO REPORT IMMEDIATELY:   Following lower endoscopy (colonoscopy or flexible sigmoidoscopy):  Excessive amounts of blood in the stool  Significant tenderness or worsening of abdominal pains  Swelling of the abdomen that is new, acute  Fever of 100F or higher   For urgent or emergent issues, a gastroenterologist can be reached at any hour by calling [phone].   DIET:  We do recommend  a small meal at first, but then you may proceed to your regular diet.  Drink plenty of fluids but you should avoid alcoholic beverages for 24 hours.  ACTIVITY:  You should plan to take it easy for the rest of today and you should NOT DRIVE or use heavy machinery until tomorrow (because of the sedation medicines used during the test).    FOLLOW UP: Our staff will call the number listed on your records the next business day following your procedure to check on you and address any questions or concerns that you may have regarding the information given to you following your procedure. If we do not reach you, we will leave a message.  However, if you are feeling well and you are not experiencing any problems, there is no need to return our call.  We will assume that you have returned to your regular daily activities without incident.  If any biopsies were taken you will be contacted by phone or by letter within the next 1-3 weeks.  Please call us at [phone] if you have not heard about the biopsies in 3 weeks.    SIGNATURES/CONFIDENTIALITY: You and/or your care partner have signed paperwork which will be entered into your electronic medical record.  These signatures attest to the fact that that the information above on your After Visit Summary has been reviewed and is understood.  Full responsibility of the  confidentiality of this discharge information lies with you and/or your care-partner.

## 2017-08-24 NOTE — Progress Notes (Signed)
To PACU, VSS. Report to [email or web address] 

## 2017-08-25 ENCOUNTER — Telehealth: Payer: Self-pay | Admitting: *Deleted

## 2017-08-25 NOTE — Telephone Encounter (Signed)
No answer for post procedure call back. Left message and will attempt to call back later this afternoon. SM

## 2017-08-25 NOTE — Telephone Encounter (Signed)
No answer. Number identifier. Message left to call if questions or concerns.

## 2017-11-06 ENCOUNTER — Ambulatory Visit: Payer: Self-pay | Admitting: Internal Medicine

## 2017-12-24 ENCOUNTER — Emergency Department (HOSPITAL_COMMUNITY): Payer: Medicaid Other

## 2017-12-24 ENCOUNTER — Emergency Department (HOSPITAL_COMMUNITY)
Admission: EM | Admit: 2017-12-24 | Discharge: 2017-12-24 | Disposition: A | Discharge status: Home / Self Care | Payer: Medicaid Other | Attending: Emergency Medicine | Admitting: Emergency Medicine

## 2017-12-24 ENCOUNTER — Other Ambulatory Visit: Payer: Self-pay

## 2017-12-24 ENCOUNTER — Encounter (HOSPITAL_COMMUNITY): Payer: Self-pay | Admitting: Emergency Medicine

## 2017-12-24 DIAGNOSIS — J45909 Unspecified asthma, uncomplicated: Secondary | ICD-10-CM | POA: Insufficient documentation

## 2017-12-24 DIAGNOSIS — Z79899 Other long term (current) drug therapy: Secondary | ICD-10-CM | POA: Diagnosis not present

## 2017-12-24 DIAGNOSIS — F17210 Nicotine dependence, cigarettes, uncomplicated: Secondary | ICD-10-CM | POA: Diagnosis not present

## 2017-12-24 DIAGNOSIS — M25571 Pain in right ankle and joints of right foot: Secondary | ICD-10-CM | POA: Diagnosis present

## 2017-12-24 NOTE — ED Notes (Signed)
Ortho at bedside.

## 2017-12-24 NOTE — ED Triage Notes (Signed)
Pt. Stated, I fell and hurt my ankle going to bathroom this morning.

## 2017-12-24 NOTE — Discharge Instructions (Addendum)
Home to rest, elevate your ankle and apply ice for 20 minutes at a time.  Take Motrin and Tylenol as needed as prescribed for pain.  Wear your ankle splint with a supportive shoe, weight-bear as tolerated.

## 2017-12-24 NOTE — ED Provider Notes (Signed)
[facility] EM[facility]ENC[facility] DEPART[facility] Provider Note   CSN: [id number] Arrival date & time: 12/24/17  3267     History   Chief Complaint Chief Complaint  Patient presents with  . Ankle Pain    HPI Jocelyn Garcia is a 32 y.o. female.  32yo female with right ankle pain.  Patient states that she woke up this morning to go to the bathroom and did not realize her foot was asleep, stood up and put weight on the ankle and her ankle and fell.  Patient did not hit head, did not lose consciousness, no other injuries, complaints or concerns.  Pain is located lateral right ankle, worse with bearing weight.  No previous ankle injuries.  No other complaints or concerns.     Past Medical History:  Diagnosis Date  . Anemia   . Anxiety   . Asthma    exercise induced  . Crohn's disease of small intestine with intestinal obstruction ([place]) 06/26/2017  . Kidney stones   . Migraines   . Urinary tract infection     Patient Active Problem List   Diagnosis Date Noted  . Crohn's disease of small intestine with intestinal obstruction ([place]) 06/26/2017    Past Surgical History:  Procedure Laterality Date  . WISDOM TOOTH EXTRACTION       OB History    Gravida  2   Para  2   Term  2   Preterm      AB      Living  2     SAB      TAB      Ectopic      Multiple      Live Births  2            Home Medications    Prior to Admission medications   Medication Sig Start Date End Date Taking? Authorizing Provider  acetaminophen (TYLENOL) 500 MG tablet Take 500 mg by mouth every 6 (six) hours as needed for moderate pain.    [provider]  dicyclomine (BENTYL) 20 MG tablet Take 1 tablet (20 mg total) by mouth every 6 (six) hours as needed for spasms. 08/21/17   Gatha Mayer, MD  ferrous sulfate 325 (65 FE) MG tablet Take 325 mg by mouth daily with breakfast.    [provider]  levonorgestrel (MIRENA) 20 MCG/24HR IUD 1 each by Intrauterine  route once.    [provider]  predniSONE (DELTASONE) 10 MG tablet Take 1 tablet (10 mg total) by mouth daily with breakfast. 08/24/17   Gatha Mayer, MD    Family History Family History  Problem Relation Age of Onset  . Heart disease Maternal Grandmother   . Stroke Maternal Grandmother   . Hypertension Maternal Grandmother   . Clotting disorder Maternal Grandmother   . Cancer Maternal Grandfather        type unknown  . Heart disease Mother   . Hypertension Mother   . Diabetes Mother   . Endometriosis Mother   . Kidney disease Mother        stagae 3 renal failure  . Cancer Paternal Grandmother        type unknown  . Anesthesia problems Neg Hx     Social History Social History   Tobacco Use  . Smoking status: Current Every Day Smoker    Packs/day: 0.50    Years: 10.00    Pack years: 5.00    Types: Cigarettes  . Smokeless  tobacco: Never Used  Substance Use Topics  . Alcohol use: No  . Drug use: No    Comment: over a year ago     Allergies   Patient has no known allergies.   Review of Systems Review of Systems  Constitutional: Negative for fever.  Musculoskeletal: Positive for arthralgias, gait problem and joint swelling.  Skin: Negative for color change, rash and wound.  Allergic/Immunologic: Negative for immunocompromised state.  Neurological: Negative for weakness and numbness.  Hematological: Does not bruise/bleed easily.  Psychiatric/Behavioral: Negative for confusion.  All other systems reviewed and are negative.    Physical Exam Updated Vital Signs BP 128/85 (BP Location: Left Arm)   Pulse 75   Temp 98.2 F (36.8 C) (Oral)   Resp 20   Ht 5' 4"  (1.626 m)   Wt 81.6 kg   SpO2 100%   BMI 30.90 kg/m   Physical Exam  Constitutional: She is oriented to person, place, and time. She appears well-developed and well-nourished. No distress.  HENT:  Head: Normocephalic and atraumatic.  Cardiovascular: Intact distal pulses.    Pulmonary/Chest: Effort normal.  Musculoskeletal: She exhibits tenderness. She exhibits no edema or deformity.       Right ankle: She exhibits decreased range of motion and swelling. She exhibits no ecchymosis, no deformity, no laceration and normal pulse. Tenderness. Lateral malleolus tenderness found. No medial malleolus, no head of 5th metatarsal and no proximal fibula tenderness found.       Feet:  Neurological: She is alert and oriented to person, place, and time. No sensory deficit.  Skin: Skin is warm and dry. Capillary refill takes less than 2 seconds. She is not diaphoretic. No erythema. No pallor.  Psychiatric: She has a normal mood and affect. Her behavior is normal.  Nursing note and vitals reviewed.    ED Treatments / Results  Labs (all labs ordered are listed, but only abnormal results are displayed) Labs Reviewed - No data to display  EKG None  Radiology Dg Ankle Complete Right  Result Date: 12/24/2017 CLINICAL DATA:  Patient reports missing a step and possibly twisted right ankle, patient reports lateral right ankle pain. EXAM: RIGHT ANKLE - COMPLETE 3+ VIEW COMPARISON:  None. FINDINGS: No fracture.  No bone lesion. Ankle joint normally spaced and aligned.  No arthropathic changes. Small dorsal and plantar calcaneal spurs. Soft tissues are unremarkable. IMPRESSION: 1. No fracture or dislocation. 2. Small calcaneal spurs. Electronically Signed   By: Lajean Manes M.D.   On: 12/24/2017 09:33    Procedures Procedures (including critical care time)  Medications Ordered in ED Medications - No data to display   Initial Impression / Assessment and Plan / ED Course  I have reviewed the triage vital signs and the nursing notes.  Pertinent labs & imaging results that were available during my care of the patient were reviewed by me and considered in my medical decision making (see chart for details).  Clinical Course as of Dec 25 1003  Sun Dec 25, 4030  4826 32 year old  female with right ankle injury.  X-ray is negative for fracture.  Patient was placed in an Aircast, advised to wear this with a supportive shoe, given crutches, weight-bear as tolerated.  At home recommend ice and elevate for 20 minutes at a time, Motrin and Tylenol as directed for pain.  Referral to orthopedics for follow-up in 1 week if not improving.   [LM]    Clinical Course User Index [LM] Tacy Learn, PA-C  Final Clinical Impressions(s) / ED Diagnoses   Final diagnoses:  Acute right ankle pain    ED Discharge Orders    None       Roque Lias 12/24/17 1005    Davonna Belling, MD 12/24/17 1606

## 2017-12-24 NOTE — Progress Notes (Signed)
Orthopedic Tech Progress Note Patient Details:  Jocelyn Garcia 1986/01/22 [id number]  Ortho Devices Type of Ortho Device: Crutches, Ankle Air splint Ortho Device/Splint Interventions: Application   Post Interventions Patient Tolerated: Well Instructions Provided: Care of device   [place] Pink 12/24/2017, 9:56 AM

## 2019-04-02 ENCOUNTER — Emergency Department (HOSPITAL_COMMUNITY)
Admission: EM | Admit: 2019-04-02 | Discharge: 2019-04-02 | Disposition: A | Discharge status: Home / Self Care | Payer: Self-pay | Attending: Emergency Medicine | Admitting: Emergency Medicine

## 2019-04-02 ENCOUNTER — Encounter (HOSPITAL_COMMUNITY): Payer: Self-pay | Admitting: Emergency Medicine

## 2019-04-02 ENCOUNTER — Other Ambulatory Visit: Payer: Self-pay

## 2019-04-02 DIAGNOSIS — K5090 Crohn's disease, unspecified, without complications: Secondary | ICD-10-CM | POA: Insufficient documentation

## 2019-04-02 DIAGNOSIS — Z79899 Other long term (current) drug therapy: Secondary | ICD-10-CM | POA: Insufficient documentation

## 2019-04-02 DIAGNOSIS — F17210 Nicotine dependence, cigarettes, uncomplicated: Secondary | ICD-10-CM | POA: Insufficient documentation

## 2019-04-02 DIAGNOSIS — K0889 Other specified disorders of teeth and supporting structures: Secondary | ICD-10-CM | POA: Insufficient documentation

## 2019-04-02 DIAGNOSIS — J45909 Unspecified asthma, uncomplicated: Secondary | ICD-10-CM | POA: Insufficient documentation

## 2019-04-02 MED ORDER — CLINDAMYCIN HCL 300 MG PO CAPS: 300.0000 mg | ORAL_CAPSULE | Freq: Four times a day (QID) | ORAL | 0 refills | Status: AC

## 2019-04-02 NOTE — ED Triage Notes (Signed)
C/o L lower dental pain since 12/19.

## 2019-04-02 NOTE — ED Provider Notes (Signed)
[facility] Provider Note   CSN: [id number] Arrival date & time: 04/02/19  1636     History Chief Complaint  Patient presents with  . Dental Pain    Jocelyn Garcia is a 33 y.o. female.  The history is provided by the patient. No language interpreter was used.  Dental Pain Location:  Lower Lower teeth location:  20/LL 2nd bicuspid and 19/LL 1st molar Quality:  Aching Severity:  Moderate Onset quality:  Gradual Timing:  Constant Progression:  Worsening Chronicity:  New Context: dental caries   Relieved by:  Nothing Worsened by:  Nothing Ineffective treatments:  None tried Associated symptoms: gum swelling   Risk factors: periodontal disease   Pt reports unable to get a dental appointment.  Pt reports her tooth feels higher than other teeth.  Pt reports she has a sharp pain     Past Medical History:  Diagnosis Date  . Anemia   . Anxiety   . Asthma    exercise induced  . Crohn's disease of small intestine with intestinal obstruction ([place]) 06/26/2017  . Kidney stones   . Migraines   . Urinary tract infection     Patient Active Problem List   Diagnosis Date Noted  . Crohn's disease of small intestine with intestinal obstruction ([place]) 06/26/2017    Past Surgical History:  Procedure Laterality Date  . WISDOM TOOTH EXTRACTION       OB History    Gravida  2   Para  2   Term  2   Preterm      AB      Living  2     SAB      TAB      Ectopic      Multiple      Live Births  2           Family History  Problem Relation Age of Onset  . Heart disease Maternal Grandmother   . Stroke Maternal Grandmother   . Hypertension Maternal Grandmother   . Clotting disorder Maternal Grandmother   . Cancer Maternal Grandfather        type unknown  . Heart disease Mother   . Hypertension Mother   . Diabetes Mother   . Endometriosis Mother   . Kidney disease Mother        stagae 3 renal failure  . Cancer  Paternal Grandmother        type unknown  . Anesthesia problems Neg Hx     Social History   Tobacco Use  . Smoking status: Current Every Day Smoker    Packs/day: 0.50    Years: 10.00    Pack years: 5.00    Types: Cigarettes  . Smokeless tobacco: Never Used  Substance Use Topics  . Alcohol use: No  . Drug use: No    Comment: over a year ago    Home Medications Prior to Admission medications   Medication Sig Start Date End Date Taking? Authorizing Provider  acetaminophen (TYLENOL) 500 MG tablet Take 500 mg by mouth every 6 (six) hours as needed for moderate pain.    [provider]  clindamycin (CLEOCIN) 300 MG capsule Take 1 capsule (300 mg total) by mouth 4 (four) times daily for 7 days. 04/02/19 04/09/19  Fransico Meadow, PA-C  dicyclomine (BENTYL) 20 MG tablet Take 1 tablet (20 mg total) by mouth every 6 (six) hours as needed for spasms. 08/21/17   Gatha Mayer,  MD  ferrous sulfate 325 (65 FE) MG tablet Take 325 mg by mouth daily with breakfast.    [provider]  levonorgestrel (MIRENA) 20 MCG/24HR IUD 1 each by Intrauterine route once.    [provider]  predniSONE (DELTASONE) 10 MG tablet Take 1 tablet (10 mg total) by mouth daily with breakfast. 08/24/17   Gatha Mayer, MD    Allergies    Patient has no known allergies.  Review of Systems   Review of Systems  All other systems reviewed and are negative.   Physical Exam Updated Vital Signs BP (!) 179/113 (BP Location: Left Arm)   Pulse 72   Temp 98.6 F (37 C) (Oral)   Resp 16   SpO2 99%   Physical Exam Vitals and nursing note reviewed.  Constitutional:      Appearance: She is well-developed.  HENT:     Head: Normocephalic.     Right Ear: Tympanic membrane normal.     Left Ear: Tympanic membrane normal.     Mouth/Throat:     Comments: Swelling left lower gumline,  Pulmonary:     Effort: Pulmonary effort is normal.  Abdominal:     General: There is no distension.    Musculoskeletal:        General: Normal range of motion.     Cervical back: Normal range of motion.  Neurological:     General: No focal deficit present.     Mental Status: She is alert and oriented to person, place, and time.     ED Results / Procedures / Treatments   Labs (all labs ordered are listed, but only abnormal results are displayed) Labs Reviewed - No data to display  EKG None  Radiology No results found.  Procedures Procedures (including critical care time)  Medications Ordered in ED Medications - No data to display  ED Course  I have reviewed the triage vital signs and the nursing notes.  Pertinent labs & imaging results that were available during my care of the patient were reviewed by me and considered in my medical decision making (see chart for details).    MDM Rules/Calculators/A&P                      MDM  Pt advised to schedule to see dentist for evaluation.  Rx for clindamycin Final Clinical Impression(s) / ED Diagnoses Final diagnoses:  Toothache    Rx / DC Orders ED Discharge Orders         Ordered    clindamycin (CLEOCIN) 300 MG capsule  4 times daily     04/02/19 2127        An After Visit Summary was printed and given to the patient.    Sidney Ace 04/02/19 2131    Ezequiel Essex, MD 04/02/19 2249

## 2019-04-02 NOTE — ED Notes (Signed)
Patient Alert and oriented to baseline. Stable and ambulatory to baseline. Patient verbalized understanding of the discharge instructions.  Patient belongings were taken by the patient.   

## 2019-06-14 ENCOUNTER — Encounter (HOSPITAL_COMMUNITY): Payer: Self-pay

## 2019-06-14 ENCOUNTER — Emergency Department (HOSPITAL_COMMUNITY)
Admission: EM | Admit: 2019-06-14 | Discharge: 2019-06-14 | Disposition: A | Discharge status: Home / Self Care | Payer: Medicaid Other | Attending: Emergency Medicine | Admitting: Emergency Medicine

## 2019-06-14 ENCOUNTER — Other Ambulatory Visit: Payer: Self-pay

## 2019-06-14 DIAGNOSIS — J45909 Unspecified asthma, uncomplicated: Secondary | ICD-10-CM | POA: Insufficient documentation

## 2019-06-14 DIAGNOSIS — K0889 Other specified disorders of teeth and supporting structures: Secondary | ICD-10-CM | POA: Insufficient documentation

## 2019-06-14 DIAGNOSIS — F17210 Nicotine dependence, cigarettes, uncomplicated: Secondary | ICD-10-CM | POA: Insufficient documentation

## 2019-06-14 DIAGNOSIS — Z975 Presence of (intrauterine) contraceptive device: Secondary | ICD-10-CM | POA: Insufficient documentation

## 2019-06-14 DIAGNOSIS — Z79899 Other long term (current) drug therapy: Secondary | ICD-10-CM | POA: Insufficient documentation

## 2019-06-14 MED ORDER — LIDOCAINE VISCOUS HCL 2 % MT SOLN: 15.0000 mL | OROMUCOSAL | 0 refills | Status: DC | PRN

## 2019-06-14 MED ORDER — PENICILLIN V POTASSIUM 500 MG PO TABS: 500.0000 mg | ORAL_TABLET | Freq: Four times a day (QID) | ORAL | 0 refills | Status: AC

## 2019-06-14 MED ORDER — PENICILLIN V POTASSIUM 250 MG PO TABS
500.0000 mg | ORAL_TABLET | Freq: Once | ORAL | Status: AC
  Administered 2019-06-14: 11:00:00 500 mg via ORAL
  Filled 2019-06-14: qty 2

## 2019-06-14 NOTE — ED Triage Notes (Signed)
Pt c/o possible abscessed tooth of left lower mouth; tried salt water, oragel at home w/o relief; states she does not have a dentist

## 2019-06-14 NOTE — ED Provider Notes (Signed)
[facility] EMERGENCY DEPARTMENT Provider Note   CSN: [id number] Arrival date & time: 06/14/19  2620     History No chief complaint on file.   Jocelyn Garcia is a 34 y.o. female with history of Crohn's disease, migraines, kidney stones, asthma, anemia, anxiety presents for evaluation of acute onset, persistent left mandibular dental pain for 1 week.  Reports constant aching pain which at times becomes more severe and throbbing.  Worsens with attempts to eat and exposure to hot foods, cold foods, and air.  Denies difficulty swallowing, fevers, neck stiffness or swelling.  Has been alternating ibuprofen and Aleve every 3-4 hours with some improvement in her pain.  She does not currently have a dentist.  She was seen for similar presentation in December and had improvement with antibiotics.  The history is provided by the patient.       Past Medical History:  Diagnosis Date  . Anemia   . Anxiety   . Asthma    exercise induced  . Crohn's disease of small intestine with intestinal obstruction ([place]) 06/26/2017  . Kidney stones   . Migraines   . Urinary tract infection     Patient Active Problem List   Diagnosis Date Noted  . Crohn's disease of small intestine with intestinal obstruction ([place]) 06/26/2017    Past Surgical History:  Procedure Laterality Date  . WISDOM TOOTH EXTRACTION       OB History    Gravida  2   Para  2   Term  2   Preterm      AB      Living  2     SAB      TAB      Ectopic      Multiple      Live Births  2           Family History  Problem Relation Age of Onset  . Heart disease Maternal Grandmother   . Stroke Maternal Grandmother   . Hypertension Maternal Grandmother   . Clotting disorder Maternal Grandmother   . Cancer Maternal Grandfather        type unknown  . Heart disease Mother   . Hypertension Mother   . Diabetes Mother   . Endometriosis Mother   . Kidney disease Mother        stagae 3 renal  failure  . Cancer Paternal Grandmother        type unknown  . Anesthesia problems Neg Hx     Social History   Tobacco Use  . Smoking status: Current Every Day Smoker    Packs/day: 0.50    Years: 10.00    Pack years: 5.00    Types: Cigarettes  . Smokeless tobacco: Never Used  Substance Use Topics  . Alcohol use: No  . Drug use: No    Comment: over a year ago    Home Medications Prior to Admission medications   Medication Sig Start Date End Date Taking? Authorizing Provider  acetaminophen (TYLENOL) 500 MG tablet Take 500 mg by mouth every 6 (six) hours as needed for moderate pain.    [provider]  dicyclomine (BENTYL) 20 MG tablet Take 1 tablet (20 mg total) by mouth every 6 (six) hours as needed for spasms. 08/21/17   Gatha Mayer, MD  ferrous sulfate 325 (65 FE) MG tablet Take 325 mg by mouth daily with breakfast.    [provider]  levonorgestrel (MIRENA) 20 MCG/24HR IUD  1 each by Intrauterine route once.    [provider]  lidocaine (XYLOCAINE) 2 % solution Use as directed 15 mLs in the mouth or throat as needed for mouth pain. 06/14/19   Jini Horiuchi A, PA-C  penicillin v potassium (VEETID) 500 MG tablet Take 1 tablet (500 mg total) by mouth 4 (four) times daily for 7 days. 06/14/19 06/21/19  Rodell Perna A, PA-C  predniSONE (DELTASONE) 10 MG tablet Take 1 tablet (10 mg total) by mouth daily with breakfast. 08/24/17   Gatha Mayer, MD    Allergies    Patient has no known allergies.  Review of Systems   Review of Systems  Constitutional: Negative for chills and fever.  HENT: Positive for dental problem. Negative for drooling and trouble swallowing.   Musculoskeletal: Negative for neck pain and neck stiffness.  All other systems reviewed and are negative.   Physical Exam Updated Vital Signs BP (!) 153/84 (BP Location: Left Arm)   Pulse 66   Temp 98.4 F (36.9 C) (Oral)   Resp 16   Ht 5' 5"  (1.651 m)   Wt 90.7 kg   SpO2 98%   BMI  33.28 kg/m   Physical Exam Vitals and nursing note reviewed.  Constitutional:      General: She is not in acute distress.    Appearance: She is well-developed.  HENT:     Head: Normocephalic and atraumatic.     Mouth/Throat:     Comments: Diffusely decaying dentition with some cracked and missing teeth.  Mild gingival irritation along the left mandible.  No trismus, mouth opening to at least 3 finger widths.  Patient tolerating secretions without difficulty.  No subglossal swelling.  No tenderness to palpation of the submandibular regions or anterior neck.  Tenderness to percussion overlying the left second premolar.  No fluctuance. Eyes:     General:        Right eye: No discharge.        Left eye: No discharge.     Conjunctiva/sclera: Conjunctivae normal.  Neck:     Vascular: No JVD.     Trachea: No tracheal deviation.  Cardiovascular:     Rate and Rhythm: Normal rate.  Pulmonary:     Effort: Pulmonary effort is normal.  Abdominal:     General: There is no distension.  Musculoskeletal:     Cervical back: Normal range of motion and neck supple. No rigidity or tenderness.  Lymphadenopathy:     Cervical: No cervical adenopathy.  Skin:    General: Skin is warm and dry.     Findings: No erythema.  Neurological:     Mental Status: She is alert.  Psychiatric:        Behavior: Behavior normal.     ED Results / Procedures / Treatments   Labs (all labs ordered are listed, but only abnormal results are displayed) Labs Reviewed - No data to display  EKG None  Radiology No results found.  Procedures Procedures (including critical care time)  Medications Ordered in ED Medications  penicillin v potassium (VEETID) tablet 500 mg (has no administration in time range)    ED Course  I have reviewed the triage vital signs and the nursing notes.  Pertinent labs & imaging results that were available during my care of the patient were reviewed by me and considered in my  medical decision making (see chart for details).    MDM Rules/Calculators/A&P  Patient with toothache.  No gross abscess amenable to I&D.  Patient is afebrile, initially hypertensive with improvement in her symptoms.  This is likely due to her discomfort.  Patient is nontoxic in appearance and is tolerating secretions without difficulty.  Exam unconcerning for Ludwig's angina, peritonsillar abscess, strep pharyngitis, meningitis, deep space neck infection or spread of infection.  Will treat with antibiotics, viscous lidocaine, anti-inflammatories.  Recommend follow-up with dentist on an outpatient basis and will provide resources for follow-up.  Discussed strict ED return precautions. Patient verbalized understanding of and agreement with plan and is safe for discharge home at this time.   Final Clinical Impression(s) / ED Diagnoses Final diagnoses:  Pain, dental    Rx / DC Orders ED Discharge Orders         Ordered    penicillin v potassium (VEETID) 500 MG tablet  4 times daily     06/14/19 1100    lidocaine (XYLOCAINE) 2 % solution  As needed     06/14/19 1100           Michayla Mcneil, [place] A, PA-C 06/14/19 1101    Malvin Johns, MD 06/14/19 1206

## 2019-06-14 NOTE — ED Notes (Signed)
Patient verbalizes understanding of discharge instructions. Opportunity for questioning and answers were provided. Pt discharged from ED. 

## 2019-06-14 NOTE — Discharge Instructions (Signed)
Please take all of your antibiotics until finished!   Take your antibiotics with food.  Common side effects of antibiotics include nausea, vomiting, abdominal discomfort, and diarrhea. You may help offset some of this with probiotics which you can buy or get in yogurt. Do not eat  or take the probiotics until 2 hours after your antibiotic.    Some studies suggest that certain antibiotics can reduce the efficacy of certain oral contraceptive pills (birth control), so please use additional contraceptives (condoms or other barrier method) while you are taking the antibiotics and for an additional 5 to 7 days afterwards if you are a female on these medications.  Apply warm or cool compresses to jaw throughout the day, whichever feels best. Alternate 600 mg of ibuprofen and [phone] mg of Tylenol every 3 hours as needed for pain. Do not exceed 4000 mg of Tylenol daily.  You may also use warm water salt gargles, Orajel, or other over-the-counter dental pain remedies. Use lidocaine swish and spit (do not swallow) as needed for dental pain.   Followup with a dentist is very important for ongoing evaluation and management of recurrent dental pain.  I have given you the information for the dentist on-call today as well as resources for other dentists in the area that are more affordable.  Return to emergency department for emergent changing or worsening symptoms such as fever, worsening facial swelling, difficulty breathing or swallowing, throat tightness, or vision changes.

## 2019-08-09 ENCOUNTER — Encounter (HOSPITAL_COMMUNITY): Payer: Self-pay | Admitting: Emergency Medicine

## 2019-08-09 ENCOUNTER — Other Ambulatory Visit: Payer: Self-pay

## 2019-08-09 ENCOUNTER — Emergency Department (HOSPITAL_COMMUNITY)
Admission: EM | Admit: 2019-08-09 | Discharge: 2019-08-09 | Disposition: A | Discharge status: Home / Self Care | Payer: Medicaid Other | Attending: Emergency Medicine | Admitting: Emergency Medicine

## 2019-08-09 DIAGNOSIS — F17210 Nicotine dependence, cigarettes, uncomplicated: Secondary | ICD-10-CM | POA: Insufficient documentation

## 2019-08-09 DIAGNOSIS — E669 Obesity, unspecified: Secondary | ICD-10-CM | POA: Insufficient documentation

## 2019-08-09 DIAGNOSIS — K0889 Other specified disorders of teeth and supporting structures: Secondary | ICD-10-CM

## 2019-08-09 DIAGNOSIS — K029 Dental caries, unspecified: Secondary | ICD-10-CM | POA: Insufficient documentation

## 2019-08-09 DIAGNOSIS — Z6833 Body mass index (BMI) 33.0-33.9, adult: Secondary | ICD-10-CM | POA: Insufficient documentation

## 2019-08-09 MED ORDER — NAPROXEN 250 MG PO TABS
500.0000 mg | ORAL_TABLET | Freq: Once | ORAL | Status: AC
  Administered 2019-08-09: 500 mg via ORAL
  Filled 2019-08-09: qty 2

## 2019-08-09 MED ORDER — AMOXICILLIN 500 MG PO CAPS
1000.0000 mg | ORAL_CAPSULE | Freq: Once | ORAL | Status: AC
  Administered 2019-08-09: 1000 mg via ORAL
  Filled 2019-08-09: qty 2

## 2019-08-09 MED ORDER — AMOXICILLIN 500 MG PO CAPS: 1000.0000 mg | ORAL_CAPSULE | Freq: Two times a day (BID) | ORAL | 0 refills | Status: DC

## 2019-08-09 MED ORDER — NAPROXEN 500 MG PO TABS: 500.0000 mg | ORAL_TABLET | Freq: Two times a day (BID) | ORAL | 0 refills | Status: DC

## 2019-08-09 NOTE — ED Provider Notes (Signed)
Patient is a 34 year old female with past medical history of dental pain, anemia, anxiety, asthma presenting to the emergency department for dental pain for the last 1 week. [facility] EMERGENCY DEPARTMENT Provider Note   CSN: [id number] Arrival date & time: 08/09/19  1115     History Chief Complaint  Patient presents with  . Dental Pain    MANDIE CRABBE is a 34 y.o. female.  34 year old female with past medical history of asthma, anxiety, dental pain presenting to the emergency department for dental pain for 1 week.  Has been seen multiple times in emergency department for the same.  Issues usually resolve with anti-inflammatories and antibiotics.  Patient has been encouraged to follow-up with a dentist multiple times but reports has been unable to do so.  Denies any trouble breathing, swallowing, fever, throat swelling.        Past Medical History:  Diagnosis Date  . Anemia   . Anxiety   . Asthma    exercise induced  . Crohn's disease of small intestine with intestinal obstruction ([place]) 06/26/2017  . Kidney stones   . Migraines   . Urinary tract infection     Patient Active Problem List   Diagnosis Date Noted  . Crohn's disease of small intestine with intestinal obstruction ([place]) 06/26/2017    Past Surgical History:  Procedure Laterality Date  . WISDOM TOOTH EXTRACTION       OB History    Gravida  2   Para  2   Term  2   Preterm      AB      Living  2     SAB      TAB      Ectopic      Multiple      Live Births  2           Family History  Problem Relation Age of Onset  . Heart disease Maternal Grandmother   . Stroke Maternal Grandmother   . Hypertension Maternal Grandmother   . Clotting disorder Maternal Grandmother   . Cancer Maternal Grandfather        type unknown  . Heart disease Mother   . Hypertension Mother   . Diabetes Mother   . Endometriosis Mother   . Kidney disease Mother        stagae 3 renal  failure  . Cancer Paternal Grandmother        type unknown  . Anesthesia problems Neg Hx     Social History   Tobacco Use  . Smoking status: Current Every Day Smoker    Packs/day: 0.50    Years: 10.00    Pack years: 5.00    Types: Cigarettes  . Smokeless tobacco: Never Used  Substance Use Topics  . Alcohol use: No  . Drug use: No    Comment: over a year ago    Home Medications Prior to Admission medications   Medication Sig Start Date End Date Taking? Authorizing Provider  acetaminophen (TYLENOL) 500 MG tablet Take 500 mg by mouth every 6 (six) hours as needed for moderate pain.    [provider]  amoxicillin (AMOXIL) 500 MG capsule Take 2 capsules (1,000 mg total) by mouth 2 (two) times daily. 08/09/19   Alveria Apley, PA-C  dicyclomine (BENTYL) 20 MG tablet Take 1 tablet (20 mg total) by mouth every 6 (six) hours as needed for spasms. 08/21/17   Gatha Mayer, MD  ferrous sulfate 325 (  65 FE) MG tablet Take 325 mg by mouth daily with breakfast.    [provider]  levonorgestrel (MIRENA) 20 MCG/24HR IUD 1 each by Intrauterine route once.    [provider]  lidocaine (XYLOCAINE) 2 % solution Use as directed 15 mLs in the mouth or throat as needed for mouth pain. 06/14/19   Fawze, Mina A, PA-C  naproxen (NAPROSYN) 500 MG tablet Take 1 tablet (500 mg total) by mouth 2 (two) times daily. 08/09/19   Alveria Apley, PA-C  predniSONE (DELTASONE) 10 MG tablet Take 1 tablet (10 mg total) by mouth daily with breakfast. 08/24/17   Gatha Mayer, MD    Allergies    Patient has no known allergies.  Review of Systems   Review of Systems  Constitutional: Negative for chills and fever.  HENT: Positive for dental problem and facial swelling. Negative for ear pain and sore throat.   Eyes: Negative for pain and visual disturbance.  Respiratory: Negative for cough and shortness of breath.   Cardiovascular: Negative for chest pain and palpitations.    Gastrointestinal: Negative for abdominal pain and vomiting.  Genitourinary: Negative for dysuria and hematuria.  Musculoskeletal: Negative for arthralgias and back pain.  Skin: Negative for color change and rash.  Neurological: Negative for seizures and syncope.  All other systems reviewed and are negative.   Physical Exam Updated Vital Signs BP (!) 160/117 (BP Location: Left Arm)   Pulse 93   Temp 98.5 F (36.9 C) (Oral)   Resp 18   Ht 5' 5"  (1.651 m)   Wt 90.7 kg   SpO2 100%   BMI 33.28 kg/m   Physical Exam Vitals and nursing note reviewed.  Constitutional:      General: She is not in acute distress.    Appearance: Normal appearance. She is obese. She is not ill-appearing, toxic-appearing or diaphoretic.  HENT:     Head: Normocephalic.     Mouth/Throat:     Comments: Diffusely decaying dentition with some cracked and missing teeth. Gingival irritation/inflammation along the left mandible.  No trismus, airway patent.  No obvious abscess. Patient tolerating secretions without difficulty.  No subglossal swelling.  No tenderness to palpation of the submandibular regions or anterior neck.  Tenderness to palpation in the left upper and lower gums and teeth.  No fluctuance. Eyes:     Conjunctiva/sclera: Conjunctivae normal.  Pulmonary:     Effort: Pulmonary effort is normal.  Skin:    General: Skin is dry.  Neurological:     Mental Status: She is alert.  Psychiatric:        Mood and Affect: Mood normal.     ED Results / Procedures / Treatments   Labs (all labs ordered are listed, but only abnormal results are displayed) Labs Reviewed - No data to display  EKG None  Radiology No results found.  Procedures Procedures (including critical care time)  Medications Ordered in ED Medications  amoxicillin (AMOXIL) capsule 1,000 mg (has no administration in time range)  naproxen (NAPROSYN) tablet 500 mg (has no administration in time range)    ED Course  I have  reviewed the triage vital signs and the nursing notes.  Pertinent labs & imaging results that were available during my care of the patient were reviewed by me and considered in my medical decision making (see chart for details).  Clinical Course as of Aug 09 1143  Fri Aug 09, 2019  1143 Patient with severely poor and decaying dentition  presenting with dental pain.  No signs of Ludwick's angina, peritonsillar abscess or neck abscess or infection.  Will treat with antibiotics and anti-inflammatories.  Patient strongly encouraged to follow-up with a dentist.  Advised on return precautions.   [KM]    Clinical Course User Index [KM] Kristine Royal   MDM Rules/Calculators/A&P                     Based on review of vitals, medical screening exam, lab work and/or imaging, there does not appear to be an acute, emergent etiology for the patient's symptoms. Counseled pt on good return precautions and encouraged both PCP and ED follow-up as needed.  Prior to discharge, I also discussed incidental imaging findings with patient in detail and advised appropriate, recommended follow-up in detail.  Clinical Impression: 1. Pain, dental     Disposition: Discharge  Prior to providing a prescription for a controlled substance, I independently reviewed the patient's recent prescription history on the [facility]. The patient had no recent or regular prescriptions and was deemed appropriate for a brief, less than 3 day prescription of narcotic for acute analgesia.  This note was prepared with assistance of Systems analyst. Occasional wrong-word or sound-a-like substitutions may have occurred due to the inherent limitations of voice recognition software.  Final Clinical Impression(s) / ED Diagnoses Final diagnoses:  Pain, dental    Rx / DC Orders ED Discharge Orders         Ordered    amoxicillin (AMOXIL) 500 MG capsule  2 times daily      08/09/19 1144    naproxen (NAPROSYN) 500 MG tablet  2 times daily     08/09/19 1144           Kristine Royal 08/09/19 1145    Wyvonnia Dusky, MD 08/09/19 1859

## 2019-08-09 NOTE — ED Triage Notes (Signed)
Patient arrives to ED with complaints of tooth abscess and is here for antibiotics, pain medicine, and dental consult.

## 2019-08-09 NOTE — Discharge Instructions (Signed)
Thank you for allowing me to care for you today. Please return to the emergency department if you have new or worsening symptoms. Take your medications as instructed.  ° °

## 2019-09-15 ENCOUNTER — Emergency Department (HOSPITAL_COMMUNITY)
Admission: EM | Admit: 2019-09-15 | Discharge: 2019-09-15 | Disposition: A | Discharge status: Home / Self Care | Payer: Medicaid Other | Attending: Emergency Medicine | Admitting: Emergency Medicine

## 2019-09-15 ENCOUNTER — Other Ambulatory Visit: Payer: Self-pay

## 2019-09-15 ENCOUNTER — Encounter (HOSPITAL_COMMUNITY): Payer: Self-pay | Admitting: Emergency Medicine

## 2019-09-15 DIAGNOSIS — J45990 Exercise induced bronchospasm: Secondary | ICD-10-CM | POA: Insufficient documentation

## 2019-09-15 DIAGNOSIS — M7918 Myalgia, other site: Secondary | ICD-10-CM | POA: Insufficient documentation

## 2019-09-15 DIAGNOSIS — Z20822 Contact with and (suspected) exposure to covid-19: Secondary | ICD-10-CM | POA: Insufficient documentation

## 2019-09-15 DIAGNOSIS — R05 Cough: Secondary | ICD-10-CM | POA: Insufficient documentation

## 2019-09-15 DIAGNOSIS — J029 Acute pharyngitis, unspecified: Secondary | ICD-10-CM | POA: Insufficient documentation

## 2019-09-15 DIAGNOSIS — F17210 Nicotine dependence, cigarettes, uncomplicated: Secondary | ICD-10-CM | POA: Insufficient documentation

## 2019-09-15 LAB — SARS CORONAVIRUS 2 BY RT PCR (HOSPITAL ORDER, PERFORMED IN ~~LOC~~ HOSPITAL LAB): SARS Coronavirus 2: NEGATIVE

## 2019-09-15 LAB — GROUP A STREP BY PCR: Group A Strep by PCR: NOT DETECTED

## 2019-09-15 MED ORDER — NAPROXEN 500 MG PO TABS: 500.0000 mg | ORAL_TABLET | Freq: Two times a day (BID) | ORAL | 0 refills | Status: DC | PRN

## 2019-09-15 MED ORDER — KETOROLAC TROMETHAMINE 60 MG/2ML IM SOLN
60.0000 mg | Freq: Once | INTRAMUSCULAR | Status: AC
  Administered 2019-09-15: 60 mg via INTRAMUSCULAR
  Filled 2019-09-15: qty 2

## 2019-09-15 NOTE — ED Triage Notes (Signed)
C/o sore throat since Friday.

## 2019-09-15 NOTE — Discharge Instructions (Addendum)
Please read the attachment on pharyngitis.  Please take naproxen, as prescribed.  Discontinue should you become pregnant or breast-feeding.  Do not combine with other NSAIDs such as Advil or ibuprofen.  I also recommend throat lozenges, warm tea with honey, or Chloraseptic spray if needed for additional relief of your sore throat symptoms.  I encourage you to begin taking cetirizine (generic for Zyrtec) as these symptoms may be related to seasonal allergies.  Return to the ED or seek immediate medical attention should you experience any new or worsening symptoms.

## 2019-09-15 NOTE — ED Provider Notes (Signed)
[facility] EMERGENCY DEPARTMENT Provider Note   CSN: [id number] Arrival date & time: 09/15/19  1012     History Chief Complaint  Patient presents with  . Sore Throat    Jocelyn Garcia is a 34 y.o. female with no relevant past medical history who presents to the ED with a 3-day history of sore throat.  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  Later that night she went home and had generalized body aches.  She also developed a sore throat, mild voice change, cough, runny nose and congestion, and intermittent chills.  She says that all of her symptoms have abated except for her sore throat and she has a history of strep pharyngitis.  She is concerned and would like testing.  She has not been vaccinated for COVID-19.  On exam, she is resting comfortably and denies any fever, shortness of breath, chest pain, difficulty swallowing, trismus, drooling, tongue swelling, abdominal pain, nausea or vomiting, urinary symptoms, or changes in bowel habits.  HPI     Past Medical History:  Diagnosis Date  . Anemia   . Anxiety   . Asthma    exercise induced  . Crohn's disease of small intestine with intestinal obstruction ([place]) 06/26/2017  . Kidney stones   . Migraines   . Urinary tract infection     Patient Active Problem List   Diagnosis Date Noted  . Crohn's disease of small intestine with intestinal obstruction ([place]) 06/26/2017    Past Surgical History:  Procedure Laterality Date  . WISDOM TOOTH EXTRACTION       OB History    Gravida  2   Para  2   Term  2   Preterm      AB      Living  2     SAB      TAB      Ectopic      Multiple      Live Births  2           Family History  Problem Relation Age of Onset  . Heart disease Maternal Grandmother   . Stroke Maternal Grandmother   . Hypertension Maternal Grandmother   . Clotting disorder Maternal Grandmother   . Cancer  Maternal Grandfather        type unknown  . Heart disease Mother   . Hypertension Mother   . Diabetes Mother   . Endometriosis Mother   . Kidney disease Mother        stagae 3 renal failure  . Cancer Paternal Grandmother        type unknown  . Anesthesia problems Neg Hx     Social History   Tobacco Use  . Smoking status: Current Every Day Smoker    Packs/day: 0.50    Years: 10.00    Pack years: 5.00    Types: Cigarettes  . Smokeless tobacco: Never Used  Substance Use Topics  . Alcohol use: No  . Drug use: No    Comment: over a year ago    Home Medications Prior to Admission medications   Medication Sig Start Date End Date Taking? Authorizing Provider  acetaminophen (TYLENOL) 500 MG tablet Take 500 mg by mouth every 6 (six) hours as needed for moderate pain.    [provider]  amoxicillin (AMOXIL) 500 MG capsule Take 2 capsules (1,000 mg total) by mouth 2 (two) times daily. 08/09/19  Madilyn Hook A, PA-C  dicyclomine (BENTYL) 20 MG tablet Take 1 tablet (20 mg total) by mouth every 6 (six) hours as needed for spasms. 08/21/17   Gatha Mayer, MD  ferrous sulfate 325 (65 FE) MG tablet Take 325 mg by mouth daily with breakfast.    [provider]  levonorgestrel (MIRENA) 20 MCG/24HR IUD 1 each by Intrauterine route once.    [provider]  lidocaine (XYLOCAINE) 2 % solution Use as directed 15 mLs in the mouth or throat as needed for mouth pain. 06/14/19   Fawze, Mina A, PA-C  naproxen (NAPROSYN) 500 MG tablet Take 1 tablet (500 mg total) by mouth 2 (two) times daily as needed for moderate pain. 09/15/19   Corena Herter, PA-C  predniSONE (DELTASONE) 10 MG tablet Take 1 tablet (10 mg total) by mouth daily with breakfast. 08/24/17   Gatha Mayer, MD    Allergies    Patient has no known allergies.  Review of Systems   Review of Systems  All other systems reviewed and are negative.   Physical Exam Updated Vital Signs BP (!) 148/97 (BP  Location: Left Arm)   Pulse (!) 105   Temp 98 F (36.7 C) (Oral)   Resp 16   SpO2 99%   Physical Exam Vitals and nursing note reviewed. Exam conducted with a chaperone present.  Constitutional:      General: She is not in acute distress.    Appearance: Normal appearance. She is not ill-appearing.  HENT:     Head: Normocephalic and atraumatic.     Nose: Congestion and rhinorrhea present.     Mouth/Throat:     Comments: Patent oropharynx.  No significant tonsillar hypertrophy bilaterally.  Possible exudates noted on uvula that is mildly erythematous.  No trismus.  Tolerate secretions well.  No tongue swelling.  Floor of mouth soft, nonindurated.  No tripoding. Eyes:     General: No scleral icterus.    Conjunctiva/sclera: Conjunctivae normal.  Cardiovascular:     Rate and Rhythm: Normal rate and regular rhythm.     Pulses: Normal pulses.     Heart sounds: Normal heart sounds.  Pulmonary:     Effort: Pulmonary effort is normal. No respiratory distress.     Breath sounds: Normal breath sounds. No stridor. No wheezing.  Musculoskeletal:     Cervical back: Normal range of motion. No rigidity.  Skin:    General: Skin is dry.     Capillary Refill: Capillary refill takes less than 2 seconds.  Neurological:     Mental Status: She is alert and oriented to person, place, and time.     GCS: GCS eye subscore is 4. GCS verbal subscore is 5. GCS motor subscore is 6.  Psychiatric:        Mood and Affect: Mood normal.        Behavior: Behavior normal.        Thought Content: Thought content normal.     ED Results / Procedures / Treatments   Labs (all labs ordered are listed, but only abnormal results are displayed) Labs Reviewed  GROUP A STREP BY PCR  SARS CORONAVIRUS 2 BY RT PCR (HOSPITAL ORDER, [facility])    EKG None  Radiology No results found.  Procedures Procedures (including critical care time)  Medications Ordered in ED Medications    ketorolac (TORADOL) injection 60 mg (60 mg Intramuscular Given 09/15/19 1405)    ED Course  I have  reviewed the triage vital signs and the nursing notes.  Pertinent labs & imaging results that were available during my care of the patient were reviewed by me and considered in my medical decision making (see chart for details).    MDM Rules/Calculators/A&P                          Patient states that her last menses was few weeks ago and she has not since been sexually active.  She adamantly denies any chance pregnancy.  Will provide patient with IM Toradol here in the ED.  She denies any history of CKD or PUD.    Patient's history and physical exam raises concern for upper respiratory infection.  Will obtain COVID-19 testing in addition to her request strep pharyngitis testing.  She does have exudates on her uvula which is possibly concerning for a strep pharyngitis.  However, the remainder of her exam is reassuring and there is no trismus, drooling, asymmetries, masses appreciated, difficulty swallowing, or other findings concerning for PTA/RPA/epiglottitis.  She states that her cough is largely resolved and given brevity of her illness do not feel as though imaging is warranted.  Do not feel as though laboratory work-up would yield any significant findings.  Will reevaluate after strep testing results.  Patient was negative for her COVID-19 and group A strep by PCR testing.  Clinically, she does not appear particularly ill.  Her oral pharyngeal exam is benign.  Suspect that she may have an upper respiratory infection versus viral pharyngitis versus seasonal allergies.  Will put in conservative management strategies and prescribe naproxen for additional relief.  Patient is adamant that she is not pregnant, nor breast-feeding.  Also encouraged her to take over-the-counter cetirizine and throat lozenges for additional relief.  Strict ED return precautions discussed.  All of the evaluation and  work-up results were discussed with the patient and any family at bedside. They were provided opportunity to ask any additional questions and have none at this time. They have expressed understanding of verbal discharge instructions as well as return precautions and are agreeable to the plan.   LEILANNY FLUITT was evaluated in Emergency Department on 09/15/2019 for the symptoms described in the history of present illness. She was evaluated in the context of the global COVID-19 pandemic, which necessitated consideration that the patient might be at risk for infection with the SARS-CoV-2 virus that causes COVID-19. Institutional protocols and algorithms that pertain to the evaluation of patients at risk for COVID-19 are in a state of rapid change based on information released by regulatory bodies including the CDC and federal and state organizations. These policies and algorithms were followed during the patient's care in the ED.   Final Clinical Impression(s) / ED Diagnoses Final diagnoses:  Pharyngitis, unspecified etiology    Rx / DC Orders ED Discharge Orders         Ordered    naproxen (NAPROSYN) 500 MG tablet  2 times daily PRN     Discontinue  Reprint     09/15/19 1547           Corena Herter, PA-C 09/15/19 1547    Daleen Bo, MD 09/15/19 2111

## 2021-06-02 ENCOUNTER — Emergency Department (HOSPITAL_COMMUNITY): Payer: Medicaid Other

## 2021-06-02 ENCOUNTER — Emergency Department (HOSPITAL_COMMUNITY)
Admission: EM | Admit: 2021-06-02 | Discharge: 2021-06-03 | Disposition: A | Discharge status: Home / Self Care | Payer: Medicaid Other | Attending: Emergency Medicine | Admitting: Emergency Medicine

## 2021-06-02 ENCOUNTER — Other Ambulatory Visit: Payer: Self-pay

## 2021-06-02 DIAGNOSIS — R42 Dizziness and giddiness: Secondary | ICD-10-CM | POA: Insufficient documentation

## 2021-06-02 DIAGNOSIS — R0602 Shortness of breath: Secondary | ICD-10-CM | POA: Insufficient documentation

## 2021-06-02 DIAGNOSIS — R079 Chest pain, unspecified: Secondary | ICD-10-CM | POA: Insufficient documentation

## 2021-06-02 DIAGNOSIS — Z5321 Procedure and treatment not carried out due to patient leaving prior to being seen by health care provider: Secondary | ICD-10-CM | POA: Insufficient documentation

## 2021-06-02 LAB — CBC
HCT: 36 % (ref 36.0–46.0)
Hemoglobin: 11.5 g/dL — ABNORMAL LOW (ref 12.0–15.0)
MCH: 27.3 pg (ref 26.0–34.0)
MCHC: 31.9 g/dL (ref 30.0–36.0)
MCV: 85.3 fL (ref 80.0–100.0)
Platelets: 242 10*3/uL (ref 150–400)
RBC: 4.22 MIL/uL (ref 3.87–5.11)
RDW: 14.5 % (ref 11.5–15.5)
WBC: 7.7 10*3/uL (ref 4.0–10.5)
nRBC: 0 % (ref 0.0–0.2)

## 2021-06-02 LAB — BASIC METABOLIC PANEL
Anion gap: 6 (ref 5–15)
BUN: 6 mg/dL (ref 6–20)
CO2: 27 mmol/L (ref 22–32)
Calcium: 8.9 mg/dL (ref 8.9–10.3)
Chloride: 107 mmol/L (ref 98–111)
Creatinine, Ser: 0.74 mg/dL (ref 0.44–1.00)
GFR, Estimated: >60 mL/min (ref >60)
Glucose, Bld: 100 mg/dL — ABNORMAL HIGH (ref 70–99)
Potassium: 3.7 mmol/L (ref 3.5–5.1)
Sodium: 140 mmol/L (ref 135–145)

## 2021-06-02 LAB — I-STAT BETA HCG BLOOD, ED (MC, WL, AP ONLY): I-stat hCG, quantitative: <5 m[IU]/mL (ref <5)

## 2021-06-02 LAB — TROPONIN I (HIGH SENSITIVITY): Troponin I (High Sensitivity): 3 ng/L (ref <18)

## 2021-06-02 NOTE — ED Triage Notes (Signed)
Pt here for chest "fluttering" that has been intermittent since Sunday, pt reports episodes come w/ lightheadedness and radiation to both arms. Pt reports having some shob, feels like she cant get a good breath in. ?

## 2021-06-02 NOTE — ED Provider Triage Note (Signed)
Emergency Medicine Provider Triage Evaluation Note ? ?Jocelyn Garcia , a 36 y.o. female  was evaluated in triage.  Pt complains of chest pain since Sunday.  Patient reports chest pain radiates into left arm.  Patient denies history of cardiac issues.  Patient denies any history of recent travel, DVTs.  Patient smokes. ? ?Review of Systems  ?Positive: Chest pain, shortness of breath, nausea, leg swelling ?Negative: Abdominal pain, diarrhea, fevers ? ?Physical Exam  ?BP (!) 182/108 (BP Location: Right Arm)   Pulse 80   Temp 98.3 ?F (36.8 ?C) (Oral)   Resp 16   SpO2 100%  ?Gen:   Awake, no distress   ?Resp:  Normal effort  ?MSK:   Moves extremities without difficulty  ?Other:   ? ?Medical Decision Making  ?Medically screening exam initiated at 8:10 PM.  Appropriate orders placed.  Jocelyn Garcia was informed that the remainder of the evaluation will be completed by another provider, this initial triage assessment does not replace that evaluation, and the importance of remaining in the ED until their evaluation is complete. ? ? ?  ?Azucena Cecil, PA-C ?06/02/21 2011 ? ?

## 2021-06-03 LAB — TROPONIN I (HIGH SENSITIVITY): Troponin I (High Sensitivity): 3 ng/L (ref <18)

## 2021-06-04 ENCOUNTER — Encounter (HOSPITAL_COMMUNITY): Payer: Self-pay | Admitting: *Deleted

## 2021-06-04 ENCOUNTER — Emergency Department (HOSPITAL_COMMUNITY)
Admission: EM | Admit: 2021-06-04 | Discharge: 2021-06-04 | Disposition: A | Discharge status: Home / Self Care | Payer: Medicaid Other | Attending: Emergency Medicine | Admitting: Emergency Medicine

## 2021-06-04 ENCOUNTER — Other Ambulatory Visit: Payer: Self-pay

## 2021-06-04 ENCOUNTER — Emergency Department (HOSPITAL_COMMUNITY): Payer: Medicaid Other

## 2021-06-04 DIAGNOSIS — Z79899 Other long term (current) drug therapy: Secondary | ICD-10-CM | POA: Diagnosis not present

## 2021-06-04 DIAGNOSIS — Z87891 Personal history of nicotine dependence: Secondary | ICD-10-CM | POA: Insufficient documentation

## 2021-06-04 DIAGNOSIS — R42 Dizziness and giddiness: Secondary | ICD-10-CM | POA: Diagnosis not present

## 2021-06-04 DIAGNOSIS — I10 Essential (primary) hypertension: Secondary | ICD-10-CM | POA: Diagnosis not present

## 2021-06-04 DIAGNOSIS — R002 Palpitations: Secondary | ICD-10-CM | POA: Insufficient documentation

## 2021-06-04 LAB — CBC WITH DIFFERENTIAL/PLATELET
Abs Immature Granulocytes: 0.03 10*3/uL (ref 0.00–0.07)
Basophils Absolute: 0 10*3/uL (ref 0.0–0.1)
Basophils Relative: 1 %
Eosinophils Absolute: 0 10*3/uL (ref 0.0–0.5)
Eosinophils Relative: 1 %
HCT: 39.2 % (ref 36.0–46.0)
Hemoglobin: 13 g/dL (ref 12.0–15.0)
Immature Granulocytes: 0 %
Lymphocytes Relative: 21 %
Lymphs Abs: 1.8 10*3/uL (ref 0.7–4.0)
MCH: 28 pg (ref 26.0–34.0)
MCHC: 33.2 g/dL (ref 30.0–36.0)
MCV: 84.3 fL (ref 80.0–100.0)
Monocytes Absolute: 0.4 10*3/uL (ref 0.1–1.0)
Monocytes Relative: 5 %
Neutro Abs: 6.3 10*3/uL (ref 1.7–7.7)
Neutrophils Relative %: 72 %
Platelets: 236 10*3/uL (ref 150–400)
RBC: 4.65 MIL/uL (ref 3.87–5.11)
RDW: 14.3 % (ref 11.5–15.5)
WBC: 8.6 10*3/uL (ref 4.0–10.5)
nRBC: 0 % (ref 0.0–0.2)

## 2021-06-04 LAB — BASIC METABOLIC PANEL
Anion gap: 8 (ref 5–15)
BUN: <5 mg/dL — ABNORMAL LOW (ref 6–20)
CO2: 23 mmol/L (ref 22–32)
Calcium: 8.9 mg/dL (ref 8.9–10.3)
Chloride: 106 mmol/L (ref 98–111)
Creatinine, Ser: 0.62 mg/dL (ref 0.44–1.00)
GFR, Estimated: >60 mL/min (ref >60)
Glucose, Bld: 96 mg/dL (ref 70–99)
Potassium: 3.4 mmol/L — ABNORMAL LOW (ref 3.5–5.1)
Sodium: 137 mmol/L (ref 135–145)

## 2021-06-04 LAB — I-STAT BETA HCG BLOOD, ED (MC, WL, AP ONLY): I-stat hCG, quantitative: <5 m[IU]/mL (ref <5)

## 2021-06-04 LAB — TROPONIN I (HIGH SENSITIVITY)
Troponin I (High Sensitivity): 3 ng/L (ref <18)
Troponin I (High Sensitivity): 4 ng/L (ref <18)

## 2021-06-04 MED ORDER — HYDRALAZINE HCL 25 MG PO TABS
25.0000 mg | ORAL_TABLET | Freq: Once | ORAL | Status: AC
  Administered 2021-06-04: 25 mg via ORAL
  Filled 2021-06-04: qty 1

## 2021-06-04 MED ORDER — AMLODIPINE BESYLATE 5 MG PO TABS: 5.0000 mg | ORAL_TABLET | Freq: Once | ORAL | Status: DC

## 2021-06-04 MED ORDER — AMLODIPINE BESYLATE 5 MG PO TABS: 5.0000 mg | ORAL_TABLET | Freq: Every day | ORAL | 0 refills | Status: DC

## 2021-06-04 NOTE — Discharge Instructions (Signed)
Please follow-up with a primary care doctor to discuss further testing and management of your blood pressure as well as your episodes of lightheadedness and palpitations. ? ?If you have any episodes of passing out, any chest pain or difficulty in breathing or other new concerning symptom please return to the ER for reassessment. ?

## 2021-06-04 NOTE — ED Provider Notes (Signed)
?[facility] ?Provider Note ? ? ?CSN: [id number] ?Arrival date & time: 06/04/21  1105 ? ?  ? ?History ? ?Chief Complaint  ?Patient presents with  ? Palpitations  ? ? ?Jocelyn Garcia is a 36 y.o. female.  Presented to the emergency department with concern for episodes of palpitations, lightheadedness.  Patient states for the past couple weeks she has been having similar symptoms, seem to come and go at random, without any particular trigger.  With the exception not sudden movements seem to make her lightheadedness worse.  Currently does not have any ongoing chest pain, palpitations or lightheadedness. ? ?States that she has a long history of high blood pressure and has been told that her blood pressure is elevated but has never taken medicine for her blood pressure or followed up with her primary care doctor.  States that she was recently approved for Medicaid and is interested in following up with her regular doctor. ? ?Per review of past ER visits and past vital signs, patient has long history of elevated blood pressure readings dating back many years. ? ?HPI ? ?  ? ?Home Medications ?Prior to Admission medications   ?Medication Sig Start Date End Date Taking? Authorizing Provider  ?amLODipine (NORVASC) 5 MG tablet Take 1 tablet (5 mg total) by mouth daily. 06/04/21  Yes Lucrezia Starch, MD  ?acetaminophen (TYLENOL) 500 MG tablet Take 500 mg by mouth every 6 (six) hours as needed for moderate pain.    [provider]  ?amoxicillin (AMOXIL) 500 MG capsule Take 2 capsules (1,000 mg total) by mouth 2 (two) times daily. 08/09/19   Alveria Apley, PA-C  ?dicyclomine (BENTYL) 20 MG tablet Take 1 tablet (20 mg total) by mouth every 6 (six) hours as needed for spasms. 08/21/17   Gatha Mayer, MD  ?ferrous sulfate 325 (65 FE) MG tablet Take 325 mg by mouth daily with breakfast.    [provider]  ?levonorgestrel (MIRENA) 20 MCG/24HR IUD 1 each by Intrauterine route  once.    [provider]  ?lidocaine (XYLOCAINE) 2 % solution Use as directed 15 mLs in the mouth or throat as needed for mouth pain. 06/14/19   Fawze, Mina A, PA-C  ?naproxen (NAPROSYN) 500 MG tablet Take 1 tablet (500 mg total) by mouth 2 (two) times daily as needed for moderate pain. 09/15/19   Corena Herter, PA-C  ?predniSONE (DELTASONE) 10 MG tablet Take 1 tablet (10 mg total) by mouth daily with breakfast. 08/24/17   Gatha Mayer, MD  ?   ? ?Allergies    ?Patient has no known allergies.   ? ?Review of Systems   ?Review of Systems  ?Constitutional:  Negative for chills and fever.  ?HENT:  Negative for ear pain and sore throat.   ?Eyes:  Negative for pain and visual disturbance.  ?Respiratory:  Negative for cough and shortness of breath.   ?Cardiovascular:  Positive for palpitations. Negative for chest pain.  ?Gastrointestinal:  Negative for abdominal pain and vomiting.  ?Genitourinary:  Negative for dysuria and hematuria.  ?Musculoskeletal:  Negative for arthralgias and back pain.  ?Skin:  Negative for color change and rash.  ?Neurological:  Positive for light-headedness. Negative for seizures and syncope.  ?All other systems reviewed and are negative. ? ?Physical Exam ?Updated Vital Signs ?BP (!) 169/95   Pulse 79   Temp 98.2 ?F (36.8 ?C) (Oral)   Resp 15   SpO2 100%  ?Physical Exam ?Vitals and  nursing note reviewed.  ?Constitutional:   ?   General: She is not in acute distress. ?   Appearance: She is well-developed.  ?HENT:  ?   Head: Normocephalic and atraumatic.  ?Eyes:  ?   Conjunctiva/sclera: Conjunctivae normal.  ?Cardiovascular:  ?   Rate and Rhythm: Normal rate and regular rhythm.  ?   Heart sounds: No murmur heard. ?Pulmonary:  ?   Effort: Pulmonary effort is normal. No respiratory distress.  ?   Breath sounds: Normal breath sounds.  ?Abdominal:  ?   Palpations: Abdomen is soft.  ?   Tenderness: There is no abdominal tenderness.  ?Musculoskeletal:     ?   General: No swelling.  ?    Cervical back: Neck supple.  ?Skin: ?   General: Skin is warm and dry.  ?   Capillary Refill: Capillary refill takes less than 2 seconds.  ?Neurological:  ?   Mental Status: She is alert.  ?   Comments: AAOx3 ?CN 2-12 intact, speech clear visual fields intact ?5/5 strength in b/l UE and LE ?Sensation to light touch intact in b/l UE and LE ?Normal FNF ?Normal gait  ?Psychiatric:     ?   Mood and Affect: Mood normal.  ? ? ?ED Results / Procedures / Treatments   ?Labs ?(all labs ordered are listed, but only abnormal results are displayed) ?Labs Reviewed  ?BASIC METABOLIC PANEL - Abnormal; Notable for the following components:  ?    Result Value  ? Potassium 3.4 (*)   ? BUN <5 (*)   ? All other components within normal limits  ?CBC WITH DIFFERENTIAL/PLATELET  ?I-STAT BETA HCG BLOOD, ED ([facility], [facility], [facility] ONLY)  ?TROPONIN I (HIGH SENSITIVITY)  ?TROPONIN I (HIGH SENSITIVITY)  ? ? ?EKG ?None ? ?Radiology ?DG Chest 2 View ? ?Result Date: 06/04/2021 ?CLINICAL DATA:  Chest pain with palpitations for 3 days. History of smoking. EXAM: CHEST - 2 VIEW COMPARISON:  Radiographs 06/02/2021. FINDINGS: The heart size and mediastinal contours are normal. The lungs are clear. There is no pleural effusion or pneumothorax. No acute osseous findings are identified. IMPRESSION: Stable chest.  No active cardiopulmonary process. Electronically Signed   By: Richardean Sale M.D.   On: 06/04/2021 11:49   ? ?Procedures ?Procedures  ? ? ?Medications Ordered in ED ?Medications  ?hydrALAZINE (APRESOLINE) tablet 25 mg (25 mg Oral Given 06/04/21 1934)  ? ? ?ED Course/ Medical Decision Making/ A&P ?  ?                        ?Medical Decision Making ?Risk ?Prescription drug management. ? ? ?36 year old lady presented to ER with concern for occasional palpitations and lightheadedness.  On physical exam she appeared well in no acute distress and had normal vital signs except for hypertension.  Basic lab work was obtained and grossly stable, she did not have any  renal failure, electrolyte derangement or anemia.  EKG without acute ischemic change.  Sinus rhythm.  CXR independently reviewed.  No acute findings.  She was provided a dose of oral antihypertensive and her blood pressure improved some.  Based on review of her chart, her hypertension is a longstanding issue that has not been addressed.  Because of of her reassuring work-up and her lack of ongoing symptoms, feel her picture is not consistent with hypertensive urgency/emergency and feel she is appropriate for discharge and outpatient management.  She does seem motivated and is interested in following up with  a primary care doctor.  Will start on course of amlodipine and stressed need to have further monitoring and testing in the outpatient setting.  ? ? ? ?After the discussed management above, the patient was determined to be safe for discharge.  The patient was in agreement with this plan and all questions regarding their care were answered.  ED return precautions were discussed and the patient will return to the ED with any significant worsening of condition. ? ? ? ? ? ? ? ? ?Final Clinical Impression(s) / ED Diagnoses ?Final diagnoses:  ?Palpitations  ?Lightheadedness  ? ? ?Rx / DC Orders ?ED Discharge Orders   ? ?      Ordered  ?  amLODipine (NORVASC) 5 MG tablet  Daily       ? 06/04/21 2123  ? ?  ?  ? ?  ? ? ?  ?Lucrezia Starch, MD ?06/05/21 0102 ? ?

## 2021-06-04 NOTE — ED Triage Notes (Signed)
Pt reports having episodes since Sunday of feeling lightheaded and having palpitations. Was here on 3/1 for same but [facility]. No acute distress is noted at triage.  ?

## 2021-06-04 NOTE — ED Provider Triage Note (Signed)
Emergency Medicine Provider Triage Evaluation Note ? ?Jocelyn Garcia , a 36 y.o. female  was evaluated in triage.  Pt complains of palpitations and occasional lightheadedness.  Reports that this has been happening intermittently for 1 to 2 weeks.  She did check in for this 2 days ago however left before being seen.  No history of ACS, DVT/PE, recent travel, recent surgery, estrogen use or leg swelling. ? ?Denies any fever or chills but states that she probably should have been seen by a PCP for her elevated blood pressure by now.  Denies headaches or visual disturbance. ? ?Review of Systems  ?Positive: As above ?Negative: Chest pain or difficulty breathing ? ?Physical Exam  ?BP (!) 177/112 (BP Location: Right Arm)   Pulse 95   Temp 98.6 ?F (37 ?C) (Oral)   Resp 18   SpO2 100%  ?Gen:   Awake, no distress   ?Resp:  Normal effort  ?MSK:   Moves extremities without difficulty  ?Other:  RRR, CTA B ? ?Medical Decision Making  ?Medically screening exam initiated at 11:40 AM.  Appropriate orders placed.  Jocelyn Garcia was informed that the remainder of the evaluation will be completed by another provider, this initial triage assessment does not replace that evaluation, and the importance of remaining in the ED until their evaluation is complete. ? ? ? ?EKG and chest x-ray ordered.  Norvasc also order for patient's hypertension.  RN verbalized understanding. ?  ?Rhae Hammock, PA-C ?06/04/21 1143 ? ?

## 2021-06-05 ENCOUNTER — Other Ambulatory Visit: Payer: Self-pay

## 2021-06-05 ENCOUNTER — Encounter (HOSPITAL_COMMUNITY): Payer: Self-pay | Admitting: Emergency Medicine

## 2021-06-05 ENCOUNTER — Emergency Department (HOSPITAL_COMMUNITY)
Admission: EM | Admit: 2021-06-05 | Discharge: 2021-06-05 | Disposition: A | Discharge status: Home / Self Care | Payer: Medicaid Other | Attending: Emergency Medicine | Admitting: Emergency Medicine

## 2021-06-05 DIAGNOSIS — R002 Palpitations: Secondary | ICD-10-CM | POA: Diagnosis present

## 2021-06-05 DIAGNOSIS — Z79899 Other long term (current) drug therapy: Secondary | ICD-10-CM | POA: Diagnosis not present

## 2021-06-05 DIAGNOSIS — I10 Essential (primary) hypertension: Secondary | ICD-10-CM

## 2021-06-05 DIAGNOSIS — F17201 Nicotine dependence, unspecified, in remission: Secondary | ICD-10-CM

## 2021-06-05 DIAGNOSIS — Z87891 Personal history of nicotine dependence: Secondary | ICD-10-CM | POA: Diagnosis not present

## 2021-06-05 HISTORY — DX: Nicotine dependence, unspecified, in remission: F17.201

## 2021-06-05 LAB — D-DIMER, QUANTITATIVE: D-Dimer, Quant: 0.6 ug/mL-FEU — ABNORMAL HIGH (ref 0.00–0.50)

## 2021-06-05 LAB — T4, FREE: Free T4: 0.98 ng/dL (ref 0.61–1.12)

## 2021-06-05 LAB — TSH: TSH: 2.57 u[IU]/mL (ref 0.350–4.500)

## 2021-06-05 MED ORDER — AMLODIPINE BESYLATE 5 MG PO TABS
5.0000 mg | ORAL_TABLET | Freq: Once | ORAL | Status: AC
  Administered 2021-06-05: 5 mg via ORAL
  Filled 2021-06-05: qty 1

## 2021-06-05 MED ORDER — HYDRALAZINE HCL 25 MG PO TABS
25.0000 mg | ORAL_TABLET | Freq: Once | ORAL | Status: AC
  Administered 2021-06-05: 25 mg via ORAL
  Filled 2021-06-05: qty 1

## 2021-06-05 NOTE — ED Notes (Signed)
Pt denies palpitation, pain at this time. Resp even/unlabored.  Ambulate dto restroom with steady gait. Tolerating fluids well. ? ?

## 2021-06-05 NOTE — ED Provider Triage Note (Signed)
Emergency Medicine Provider Triage Evaluation Note ? ?Jocelyn Garcia , a 36 y.o. female  was evaluated in triage.  Pt complains of palpitations onset prior to arrival.  Patient notes that she was sitting on the couch when she noticed her heart was racing.  Denies chest pain, shortness of breath, nausea, vomiting. Denies history of MI, DVT/PE, recent immobilization/travel, recent surgery, OCP, HRT, leg swelling, anticoagulant use.  Denies past medical history of arrhythmias.  She notes that she was not able to pick up the amlodipine. ? ? ?Per patient chart review: Patient had an MSE completed on 06/02/2021 for chest pain however left without being seen following triage.  Patient was evaluated in the ED on 06/04/2021 for palpitations, at that time she had a negative work-up.  Her high blood pressure was treated in the ED with hydralazine, and she was sent home with a prescription for amlodipine with strict return precautions with her primary care provider. ? ?Review of Systems  ?Positive: As per HPI above ?Negative: As per HPI above ? ?Physical Exam  ?BP (!) 180/121 (BP Location: Right Arm)   Pulse 92   Temp 98.7 ?F (37.1 ?C) (Oral)   Resp 19   SpO2 99%  ?Gen:   Awake, no distress   ?Resp:  Normal effort  ?MSK:   Moves extremities without difficulty  ?Other:  No chest wall tenderness to palpation.  No tenderness to palpation noted to the bilateral lower extremities.  No lower extremity swelling appreciated. ? ?Medical Decision Making  ?Medically screening exam initiated at 2:37 AM.  Appropriate orders placed.  Jocelyn Garcia was informed that the remainder of the evaluation will be completed by another provider, this initial triage assessment does not replace that evaluation, and the importance of remaining in the ED until their evaluation is complete. ?  ?Jaysin Gayler A, PA-C ?06/05/21 7001 ? ?

## 2021-06-05 NOTE — ED Provider Notes (Addendum)
[facility] EMERGENCY DEPARTMENT Provider Note   CSN: [id number] Arrival date & time: 06/05/21  0158     History  Chief Complaint  Patient presents with   Palpitations     Jocelyn Garcia is a 36 y.o. female.  Patient c/o intermittent palpitations, noticing that heart rates seems fast, and at other times skipping. Symptoms on and off in past few days, at rest. No relation to exertion or activity. No constant and/or persistent symptoms. Felt lightheaded earlier but not now. No syncope. No hx svt, afib or other dysrhythmia. No hx chd or cad. No recent heavy caffeine use or other stimulant use. Denies chest pain or discomfort. No sob or unusual doe. Denies fever or chills. No wt loss, appetite change, heat intolerance or hx thyroid disease. No recent blood loss, heavy vaginal bleeding, rectal bleeding or melena. Recent ed visit for same and labs. Denies cough or uri symptoms. No change in meds/new meds. No leg pain or swelling. No immobility, trauma or surgery. No hx dvt or pe.   The history is provided by the patient and medical records.      Home Medications Prior to Admission medications   Medication Sig Start Date End Date Taking? Authorizing Provider  acetaminophen (TYLENOL) 500 MG tablet Take 500 mg by mouth every 6 (six) hours as needed for moderate pain.   Yes [provider]  levonorgestrel (MIRENA) 20 MCG/24HR IUD 1 each by Intrauterine route once.   Yes [provider]  amLODipine (NORVASC) 5 MG tablet Take 1 tablet (5 mg total) by mouth daily. 06/04/21   Lucrezia Starch, MD  amoxicillin (AMOXIL) 500 MG capsule Take 2 capsules (1,000 mg total) by mouth 2 (two) times daily. Patient not taking: Reported on 06/05/2021 08/09/19   Alveria Apley, PA-C  dicyclomine (BENTYL) 20 MG tablet Take 1 tablet (20 mg total) by mouth every 6 (six) hours as needed for spasms. Patient not taking: Reported on 06/05/2021 08/21/17   Gatha Mayer, MD  lidocaine  (XYLOCAINE) 2 % solution Use as directed 15 mLs in the mouth or throat as needed for mouth pain. Patient not taking: Reported on 06/05/2021 06/14/19   Rodell Perna A, PA-C  naproxen (NAPROSYN) 500 MG tablet Take 1 tablet (500 mg total) by mouth 2 (two) times daily as needed for moderate pain. 09/15/19   Corena Herter, PA-C  predniSONE (DELTASONE) 10 MG tablet Take 1 tablet (10 mg total) by mouth daily with breakfast. Patient not taking: Reported on 06/05/2021 08/24/17   Gatha Mayer, MD      Allergies    Patient has no known allergies.    Review of Systems   Review of Systems  Constitutional:  Negative for fever.  HENT:  Negative for sore throat.   Eyes:  Negative for redness.  Respiratory:  Negative for cough.   Cardiovascular:  Positive for palpitations. Negative for chest pain and leg swelling.  Gastrointestinal:  Negative for abdominal pain, blood in stool, diarrhea and vomiting.  Genitourinary:  Negative for dysuria and flank pain.  Musculoskeletal:  Negative for back pain and neck pain.  Skin:  Negative for rash.  Neurological:  Negative for headaches.  Hematological:  Does not bruise/bleed easily.  Psychiatric/Behavioral:  Negative for confusion.    Physical Exam Updated Vital Signs BP (!) 168/111    Pulse 81    Temp 98.9 F (37.2 C)    Resp 18    SpO2 100%  Physical Exam  Vitals and nursing note reviewed.  Constitutional:      Appearance: Normal appearance. She is well-developed.  HENT:     Head: Atraumatic.     Nose: Nose normal.     Mouth/Throat:     Mouth: Mucous membranes are moist.  Eyes:     General: No scleral icterus.    Conjunctiva/sclera: Conjunctivae normal.  Neck:     Trachea: No tracheal deviation.     Comments: Thyroid not grossly enlarged or tender.  Cardiovascular:     Rate and Rhythm: Normal rate and regular rhythm.     Pulses: Normal pulses.     Heart sounds: Normal heart sounds. No murmur heard.   No friction rub. No gallop.  Pulmonary:      Effort: Pulmonary effort is normal. No respiratory distress.     Breath sounds: Normal breath sounds.  Abdominal:     General: Bowel sounds are normal. There is no distension.     Palpations: Abdomen is soft. There is no mass.     Tenderness: There is no abdominal tenderness.     Comments: No bruits  Genitourinary:    Comments: No cva tenderness.  Musculoskeletal:        General: No swelling or tenderness.     Cervical back: Normal range of motion and neck supple. No rigidity. No muscular tenderness.     Right lower leg: No edema.     Left lower leg: No edema.  Skin:    General: Skin is warm and dry.     Findings: No rash.  Neurological:     Mental Status: She is alert.     Comments: Alert, speech normal.   Psychiatric:        Mood and Affect: Mood normal.    ED Results / Procedures / Treatments   Labs (all labs ordered are listed, but only abnormal results are displayed) Results for orders placed or performed during the hospital encounter of 06/05/21  D-dimer, quantitative  Result Value Ref Range   D-Dimer, Quant 0.60 (H) 0.00 - 0.50 ug/mL-FEU  TSH  Result Value Ref Range   TSH 2.570 0.350 - 4.500 uIU/mL  T4, free  Result Value Ref Range   Free T4 0.98 0.61 - 1.12 ng/dL   DG Chest 2 View  Result Date: 06/04/2021 CLINICAL DATA:  Chest pain with palpitations for 3 days. History of smoking. EXAM: CHEST - 2 VIEW COMPARISON:  Radiographs 06/02/2021. FINDINGS: The heart size and mediastinal contours are normal. The lungs are clear. There is no pleural effusion or pneumothorax. No acute osseous findings are identified. IMPRESSION: Stable chest.  No active cardiopulmonary process. Electronically Signed   By: Richardean Sale M.D.   On: 06/04/2021 11:49   DG Chest Port 1 View  Result Date: 06/02/2021 CLINICAL DATA:  Chest pain. EXAM: PORTABLE CHEST 1 VIEW COMPARISON:  11/17/2005. FINDINGS: The heart size and mediastinal contours are within normal limits. Both lungs are clear. No  acute osseous abnormality. IMPRESSION: No active disease. Electronically Signed   By: Brett Fairy M.D.   On: 06/02/2021 20:58    EKG EKG Interpretation  Date/Time:  Saturday June 05 2021 07:50:29 EST Ventricular Rate:  65 PR Interval:  162 QRS Duration: 87 QT Interval:  426 QTC Calculation: 443 R Axis:   76 Text Interpretation: Sinus rhythm Confirmed by Lajean Saver [phone]) on 06/05/2021 8:31:31 AM  Radiology DG Chest 2 View  Result Date: 06/04/2021 CLINICAL DATA:  Chest pain with palpitations for 3  days. History of smoking. EXAM: CHEST - 2 VIEW COMPARISON:  Radiographs 06/02/2021. FINDINGS: The heart size and mediastinal contours are normal. The lungs are clear. There is no pleural effusion or pneumothorax. No acute osseous findings are identified. IMPRESSION: Stable chest.  No active cardiopulmonary process. Electronically Signed   By: Richardean Sale M.D.   On: 06/04/2021 11:49    Procedures Procedures    Medications Ordered in ED Medications  amLODipine (NORVASC) tablet 5 mg (5 mg Oral Given 06/05/21 0826)  hydrALAZINE (APRESOLINE) tablet 25 mg (25 mg Oral Given 06/05/21 1053)    ED Course/ Medical Decision Making/ A&P                           Medical Decision Making Problems Addressed: Essential hypertension: chronic illness or injury with exacerbation, progression, or side effects of treatment Palpitations: acute illness or injury Uncontrolled hypertension: chronic illness or injury that poses a threat to life or bodily functions  Amount and/or Complexity of Data Reviewed External Data Reviewed: labs and notes. Labs: ordered. Decision-making details documented in ED Course. ECG/medicine tests: ordered and independent interpretation performed. Decision-making details documented in ED Course.  Risk Prescription drug management.   Labs sent from triage.   Reviewed nursing notes and prior charts for additional history.  External reports reviewed. Recent labs  reviewed, hct at baseline, chem normal. Will add thyroid tests. Repeat ecg.  On review prior visits, it appears pts bp has been elevated for some time - recently given rx during ed visit but has not yet picked up rx.   Labs from triage reviewed/interpreted by me - ddimer slightly elevated. Pts clinical presentation (intermittent sense of palpitations) felt not c/w acs, not c/w dvt/pe. Pt currently with no cp or discomfort of any sort, no sob.  Thyroid tests normal.   Bp is high, hx same. Pt has not had bp meds yet this AM - will give dose of her med.   Recent cxr reviewed/interpreted by me - no pna.   Pt indicates had not yet picked up her bp rx at store from prior visit - indicates will get today. Dose of bp med given in ED.  Recheck pt, asking for po/food. Fluids/food provided.   On recheck, no headache, no chest pain or discomfort, no sob. Nsr on monitor.  Hr is 64, rr is 12, pulse ox is 100%.   Pt currently appears stable for d/c.   Return precautions provided.             Final Clinical Impression(s) / ED Diagnoses Final diagnoses:  Essential hypertension  Uncontrolled hypertension  Palpitations    Rx / DC Orders ED Discharge Orders     None           Lajean Saver, MD 06/05/21 1138

## 2021-06-05 NOTE — ED Triage Notes (Signed)
Patient reports recurrent palpitations this evening , seen here for the same complaint last night , denies SOB or cough .  ?

## 2021-06-05 NOTE — ED Notes (Signed)
ED Provider at bedside. 

## 2021-06-05 NOTE — Discharge Instructions (Addendum)
It was our pleasure to provide your ER care today - we hope that you feel better. ? ?Take blood pressure med as prescribed, limit salt intake, limit caffeine use, avoid smoking, and see additional blood pressure info - follow up with primary care doctor for recheck and recheck of blood pressure this coming week.  ? ?On recent labs, your potassium level was slightly low - eat plenty of fruits and vegetables, and follow up with primary care doctor in 1-2 weeks.  ? ?Return to ER if worse, new symptoms, new/severe pain, chest pain, trouble breathing, persistent fast heart beat, fainting, or other concern.  ? ? ?

## 2021-06-06 ENCOUNTER — Emergency Department (HOSPITAL_COMMUNITY): Payer: Medicaid Other

## 2021-06-06 ENCOUNTER — Emergency Department (HOSPITAL_COMMUNITY)
Admission: EM | Admit: 2021-06-06 | Discharge: 2021-06-06 | Disposition: A | Discharge status: Home / Self Care | Payer: Medicaid Other | Attending: Emergency Medicine | Admitting: Emergency Medicine

## 2021-06-06 DIAGNOSIS — R079 Chest pain, unspecified: Secondary | ICD-10-CM | POA: Diagnosis present

## 2021-06-06 DIAGNOSIS — R002 Palpitations: Secondary | ICD-10-CM | POA: Diagnosis not present

## 2021-06-06 DIAGNOSIS — F17210 Nicotine dependence, cigarettes, uncomplicated: Secondary | ICD-10-CM | POA: Insufficient documentation

## 2021-06-06 DIAGNOSIS — R0602 Shortness of breath: Secondary | ICD-10-CM | POA: Insufficient documentation

## 2021-06-06 DIAGNOSIS — J40 Bronchitis, not specified as acute or chronic: Secondary | ICD-10-CM

## 2021-06-06 LAB — I-STAT CHEM 8, ED
BUN: 7 mg/dL (ref 6–20)
Calcium, Ion: 1.1 mmol/L — ABNORMAL LOW (ref 1.15–1.40)
Chloride: 107 mmol/L (ref 98–111)
Creatinine, Ser: 0.6 mg/dL (ref 0.44–1.00)
Glucose, Bld: 81 mg/dL (ref 70–99)
HCT: 38 % (ref 36.0–46.0)
Hemoglobin: 12.9 g/dL (ref 12.0–15.0)
Potassium: 3.7 mmol/L (ref 3.5–5.1)
Sodium: 140 mmol/L (ref 135–145)
TCO2: 23 mmol/L (ref 22–32)

## 2021-06-06 LAB — CBC WITH DIFFERENTIAL/PLATELET
Abs Immature Granulocytes: 0.03 10*3/uL (ref 0.00–0.07)
Basophils Absolute: 0 10*3/uL (ref 0.0–0.1)
Basophils Relative: 0 %
Eosinophils Absolute: 0 10*3/uL (ref 0.0–0.5)
Eosinophils Relative: 0 %
HCT: 38.9 % (ref 36.0–46.0)
Hemoglobin: 12.3 g/dL (ref 12.0–15.0)
Immature Granulocytes: 0 %
Lymphocytes Relative: 15 %
Lymphs Abs: 1.6 10*3/uL (ref 0.7–4.0)
MCH: 27.2 pg (ref 26.0–34.0)
MCHC: 31.6 g/dL (ref 30.0–36.0)
MCV: 85.9 fL (ref 80.0–100.0)
Monocytes Absolute: 0.6 10*3/uL (ref 0.1–1.0)
Monocytes Relative: 5 %
Neutro Abs: 8.8 10*3/uL — ABNORMAL HIGH (ref 1.7–7.7)
Neutrophils Relative %: 80 %
Platelets: 245 10*3/uL (ref 150–400)
RBC: 4.53 MIL/uL (ref 3.87–5.11)
RDW: 14.4 % (ref 11.5–15.5)
WBC: 11.1 10*3/uL — ABNORMAL HIGH (ref 4.0–10.5)
nRBC: 0 % (ref 0.0–0.2)

## 2021-06-06 LAB — TROPONIN I (HIGH SENSITIVITY): Troponin I (High Sensitivity): 3 ng/L (ref <18)

## 2021-06-06 MED ORDER — SODIUM CHLORIDE 0.9 % IV BOLUS
1000.0000 mL | Freq: Once | INTRAVENOUS | Status: AC
Start: 2021-06-06 — End: 2021-06-06
  Administered 2021-06-06: 1000 mL via INTRAVENOUS

## 2021-06-06 MED ORDER — IOHEXOL 350 MG/ML SOLN
100.0000 mL | Freq: Once | INTRAVENOUS | Status: AC | PRN
  Administered 2021-06-06: 100 mL via INTRAVENOUS

## 2021-06-06 MED ORDER — AZITHROMYCIN 250 MG PO TABS: 250.0000 mg | ORAL_TABLET | Freq: Every day | ORAL | 0 refills | Status: DC

## 2021-06-06 NOTE — Discharge Instructions (Signed)
You have bronchitis.  Please stop smoking. ? ?Take Z-Pak as prescribed ? ?You can contact cardiology to follow-up on your palpitations ? ?Return to ER if you have worse cough, trouble breathing, palpitations ?

## 2021-06-06 NOTE — ED Notes (Signed)
RN reviewed discharge instructions w/ pt. Follow up and prescriptions reviewed, no further questions ?

## 2021-06-06 NOTE — ED Triage Notes (Signed)
To ED via [facility] from home, c/o chest pressure that started when she was getting ready to eat lunch. Has been seen here in past few days for palpitations and chest pain, started on new BP med-- Amlodipine recently.  ?Received ASA 311m chewable prior to arrival, IV 20 G in left forearm per EMS ? ?States "I feel better -- I don't need to be bothering you guys"  ?Also states that his pressure is different than the palpitations that she has had recently.  ?Admits to vaping nicotine today, no drugs/etoh. ?

## 2021-06-06 NOTE — ED Provider Notes (Signed)
?[facility] ?Provider Note ? ? ?CSN: [id number] ?Arrival date & time: 06/06/21  1348 ? ?  ? ?History ? ?Chief Complaint  ?Patient presents with  ? Chest Pain  ? ? ?Jocelyn Garcia is a 36 y.o. female hx of HTN, here with chest pain and palpitations. Patient states that she has been having on and off chest pain and palpitations for the last several days.  Patient came in yesterday and the day before for similar symptoms.  Her troponins have been negative.  Patient actually has elevated D-dimer yesterday at 0.6.  No CTA was performed because patient was thought to be low risk.  Patient has persistent palpitations and felt that her heart rate was elevated.  Patient came in for further evaluation.  Patient does admit to vaping.  Patient states that she felt since she has been in the ED, she felt better.  She felt that she just needs to go home and follow-up with cardiology and get a Holter monitor ? ?The history is provided by the patient.  ? ?  ? ?Home Medications ?Prior to Admission medications   ?Medication Sig Start Date End Date Taking? Authorizing Provider  ?acetaminophen (TYLENOL) 500 MG tablet Take 500 mg by mouth every 6 (six) hours as needed for moderate pain.    [provider]  ?amLODipine (NORVASC) 5 MG tablet Take 1 tablet (5 mg total) by mouth daily. 06/04/21   Lucrezia Starch, MD  ?amoxicillin (AMOXIL) 500 MG capsule Take 2 capsules (1,000 mg total) by mouth 2 (two) times daily. ?Patient not taking: Reported on 06/05/2021 08/09/19   Alveria Apley, PA-C  ?dicyclomine (BENTYL) 20 MG tablet Take 1 tablet (20 mg total) by mouth every 6 (six) hours as needed for spasms. ?Patient not taking: Reported on 06/05/2021 08/21/17   Gatha Mayer, MD  ?levonorgestrel [facility]) 20 MCG/24HR IUD 1 each by Intrauterine route once.    [provider]  ?lidocaine (XYLOCAINE) 2 % solution Use as directed 15 mLs in the mouth or throat as needed for mouth pain. ?Patient not  taking: Reported on 06/05/2021 06/14/19   Rodell Perna A, PA-C  ?naproxen (NAPROSYN) 500 MG tablet Take 1 tablet (500 mg total) by mouth 2 (two) times daily as needed for moderate pain. 09/15/19   Corena Herter, PA-C  ?predniSONE (DELTASONE) 10 MG tablet Take 1 tablet (10 mg total) by mouth daily with breakfast. ?Patient not taking: Reported on 06/05/2021 08/24/17   Gatha Mayer, MD  ?   ? ?Allergies    ?Patient has no known allergies.   ? ?Review of Systems   ?Review of Systems  ?Cardiovascular:  Positive for chest pain.  ?All other systems reviewed and are negative. ? ?Physical Exam ?Updated Vital Signs ?BP (!) 139/91 (BP Location: Right Arm)   Pulse 73   Temp 98.9 ?F (37.2 ?C) (Oral)   Resp 18   Ht 5' 5"  (1.651 m)   Wt 95.3 kg   SpO2 99%   BMI 34.95 kg/m?  ?Physical Exam ?Vitals and nursing note reviewed.  ?Constitutional:   ?   Appearance: She is well-developed.  ?HENT:  ?   Head: Normocephalic.  ?Eyes:  ?   Extraocular Movements: Extraocular movements intact.  ?   Pupils: Pupils are equal, round, and reactive to light.  ?Cardiovascular:  ?   Rate and Rhythm: Normal rate and regular rhythm.  ?   Heart sounds: Normal heart sounds.  ?Pulmonary:  ?  Effort: Pulmonary effort is normal.  ?   Breath sounds: Normal breath sounds.  ?Abdominal:  ?   General: Bowel sounds are normal.  ?   Palpations: Abdomen is soft.  ?Musculoskeletal:     ?   General: Normal range of motion.  ?   Cervical back: Normal range of motion and neck supple.  ?Skin: ?   General: Skin is warm.  ?   Capillary Refill: Capillary refill takes less than 2 seconds.  ?Neurological:  ?   General: No focal deficit present.  ?   Mental Status: She is alert.  ?Psychiatric:     ?   Mood and Affect: Mood normal.     ?   Behavior: Behavior normal.  ? ? ?ED Results / Procedures / Treatments   ?Labs ?(all labs ordered are listed, but only abnormal results are displayed) ?Labs Reviewed  ?CBC WITH DIFFERENTIAL/PLATELET  ?I-STAT CHEM 8, ED  ?TROPONIN I (HIGH  SENSITIVITY)  ? ? ?EKG ?EKG Interpretation ? ?Date/Time:  Sunday June 06 2021 14:23:00 EST ?Ventricular Rate:  68 ?PR Interval:  150 ?QRS Duration: 84 ?QT Interval:  412 ?QTC Calculation: 439 ?R Axis:   85 ?Text Interpretation: Sinus rhythm Borderline T wave abnormalities No significant change since last tracing Confirmed by Wandra Arthurs [phone]) on 06/06/2021 3:11:02 PM ? ?Radiology ?No results found. ? ?Procedures ?Procedures  ? ? ?Medications Ordered in ED ?Medications  ?sodium chloride 0.9 % bolus 1,000 mL (has no administration in time range)  ? ? ?ED Course/ Medical Decision Making/ A&P ?  ?                        ?Medical Decision Making ?[name] is a 36 y.o. female here presenting with palpitations and shortness of breath.  This is patient's third ED visit over the last 3 days. Patient did have an elevated D-dimer yesterday.  Given persistent palpitations, will get a CTA chest to rule out PE.  ? ?6:41 PM ?CT showed no PE.  Patient has bronchitis on CT.  Patient states that she does smoke cigarettes.  At this point, will discharge home with Z-Pak.  Patient would like cardiology referral for possible Holter monitor ? ?Problems Addressed: ?Bronchitis: acute illness or injury ?Palpitations: acute illness or injury ? ?Amount and/or Complexity of Data Reviewed ?External Data Reviewed: radiology, ECG and notes. ?Labs: ordered. Decision-making details documented in ED Course. ?Radiology: ordered. ?ECG/medicine tests: ordered and independent interpretation performed. Decision-making details documented in ED Course. ? ?Risk ?Prescription drug management. ? ?Final Clinical Impression(s) / ED Diagnoses ?Final diagnoses:  ?None  ? ? ?Rx / DC Orders ?ED Discharge Orders   ? ? None  ? ?  ? ? ?  ?Drenda Freeze, MD ?06/06/21 1842 ? ?

## 2021-06-06 NOTE — ED Notes (Signed)
Patient transported to CT 

## 2021-07-02 ENCOUNTER — Encounter (HOSPITAL_COMMUNITY): Payer: Self-pay | Admitting: Emergency Medicine

## 2021-07-02 ENCOUNTER — Emergency Department (HOSPITAL_COMMUNITY)
Admission: EM | Admit: 2021-07-02 | Discharge: 2021-07-02 | Disposition: A | Discharge status: Home / Self Care | Payer: Medicaid Other | Attending: Emergency Medicine | Admitting: Emergency Medicine

## 2021-07-02 DIAGNOSIS — N9489 Other specified conditions associated with female genital organs and menstrual cycle: Secondary | ICD-10-CM | POA: Insufficient documentation

## 2021-07-02 DIAGNOSIS — Z79899 Other long term (current) drug therapy: Secondary | ICD-10-CM | POA: Diagnosis not present

## 2021-07-02 DIAGNOSIS — R002 Palpitations: Secondary | ICD-10-CM | POA: Insufficient documentation

## 2021-07-02 DIAGNOSIS — K047 Periapical abscess without sinus: Secondary | ICD-10-CM | POA: Insufficient documentation

## 2021-07-02 DIAGNOSIS — K0889 Other specified disorders of teeth and supporting structures: Secondary | ICD-10-CM | POA: Diagnosis present

## 2021-07-02 HISTORY — DX: Essential (primary) hypertension: I10

## 2021-07-02 LAB — BASIC METABOLIC PANEL
Anion gap: 9 (ref 5–15)
BUN: 6 mg/dL (ref 6–20)
CO2: 26 mmol/L (ref 22–32)
Calcium: 9.4 mg/dL (ref 8.9–10.3)
Chloride: 103 mmol/L (ref 98–111)
Creatinine, Ser: 0.64 mg/dL (ref 0.44–1.00)
GFR, Estimated: >60 mL/min (ref >60)
Glucose, Bld: 90 mg/dL (ref 70–99)
Potassium: 3.2 mmol/L — ABNORMAL LOW (ref 3.5–5.1)
Sodium: 138 mmol/L (ref 135–145)

## 2021-07-02 LAB — CBC WITH DIFFERENTIAL/PLATELET
Abs Immature Granulocytes: 0.03 10*3/uL (ref 0.00–0.07)
Basophils Absolute: 0 10*3/uL (ref 0.0–0.1)
Basophils Relative: 0 %
Eosinophils Absolute: 0.1 10*3/uL (ref 0.0–0.5)
Eosinophils Relative: 1 %
HCT: 40.9 % (ref 36.0–46.0)
Hemoglobin: 13.3 g/dL (ref 12.0–15.0)
Immature Granulocytes: 0 %
Lymphocytes Relative: 17 %
Lymphs Abs: 1.8 10*3/uL (ref 0.7–4.0)
MCH: 27.6 pg (ref 26.0–34.0)
MCHC: 32.5 g/dL (ref 30.0–36.0)
MCV: 84.9 fL (ref 80.0–100.0)
Monocytes Absolute: 0.6 10*3/uL (ref 0.1–1.0)
Monocytes Relative: 6 %
Neutro Abs: 7.8 10*3/uL — ABNORMAL HIGH (ref 1.7–7.7)
Neutrophils Relative %: 76 %
Platelets: 288 10*3/uL (ref 150–400)
RBC: 4.82 MIL/uL (ref 3.87–5.11)
RDW: 13.6 % (ref 11.5–15.5)
WBC: 10.3 10*3/uL (ref 4.0–10.5)
nRBC: 0 % (ref 0.0–0.2)

## 2021-07-02 LAB — I-STAT BETA HCG BLOOD, ED (MC, WL, AP ONLY): I-stat hCG, quantitative: <5 m[IU]/mL (ref <5)

## 2021-07-02 MED ORDER — DICLOFENAC SODIUM 75 MG PO TBEC: 75.0000 mg | DELAYED_RELEASE_TABLET | Freq: Two times a day (BID) | ORAL | 0 refills | Status: DC

## 2021-07-02 MED ORDER — CLINDAMYCIN HCL 300 MG PO CAPS: 300.0000 mg | ORAL_CAPSULE | Freq: Three times a day (TID) | ORAL | 0 refills | Status: AC

## 2021-07-02 NOTE — ED Triage Notes (Signed)
Patient here with complaint of top right sided dental pain and feeling like her heart is fluttering, both which started this morning. Patient denies shortness of breath, denies nausea, denies diarrhea. Patient is afebrile in triage and is in no apparent distress at this time. ?

## 2021-07-02 NOTE — ED Provider Triage Note (Signed)
Emergency Medicine Provider Triage Evaluation Note ? ?Jocelyn Garcia , a 36 y.o. female  was evaluated in triage.  Pt complains of palpitations and dental pain.  Patient reports she has had issues with multiple teeth and is working on getting several hold but started having pain over her right upper molars this morning with some swelling in the cheek.  No fevers, no difficulty swallowing.  She also reports she has been having palpitations since this morning and it feels like a fluttering in her chest, not painful.  She reports she supposed to see a heart doctor for some intermittent issues similar to this soon.  She reports she feels a bit lightheaded with this, no shortness of breath. ? ?Review of Systems  ?Positive: Dental pain, palpitations, lightheadedness ?Negative: Fevers, chest pain, shortness of breath, difficulty swallowing ? ?Physical Exam  ?BP (!) 155/104 (BP Location: Right Arm)   Pulse 90   Temp 98.6 ?F (37 ?C) (Oral)   Resp 16   SpO2 100%  ?Gen:   Awake, no distress   ?Resp:  Normal effort, CTA bilateral, heart RRR ?MSK:   Moves extremities without difficulty  ?Other:  Swelling over the right upper molars with no obvious drainage abscess, tolerating secretions, no trismus ? ?Medical Decision Making  ?Medically screening exam initiated at 9:50 AM.  Appropriate orders placed.  Jocelyn Garcia was informed that the remainder of the evaluation will be completed by another provider, this initial triage assessment does not replace that evaluation, and the importance of remaining in the ED until their evaluation is complete. ? ? ?  ?Jacqlyn Larsen, PA-C ?07/02/21 1000 ? ?

## 2021-07-02 NOTE — ED Provider Notes (Addendum)
?[facility] ?Provider Note ? ? ?CSN: [id number] ?Arrival date & time: 07/02/21  7782 ? ?  ? ?History ? ?Chief Complaint  ?Patient presents with  ? Dental Pain  ? Palpitations  ? ? ?Jocelyn Garcia is a 36 y.o. female. ? ?Pt complains of a broken tooth and pain to the right side of her face.  Pt reports her face is swollen.  Pt worried that infection is in her system  ? ?The history is provided by the patient. No language interpreter was used.  ?Dental Pain ?Location:  Upper ?Quality:  Aching ?Severity:  Moderate ?Onset quality:  Gradual ?Duration:  2 days ?Timing:  Constant ?Progression:  Worsening ?Chronicity:  New ?Context: abscess and poor dentition   ?Relieved by:  Nothing ?Worsened by:  Nothing ?Palpitations ? ?  ? ?Home Medications ?Prior to Admission medications   ?Medication Sig Start Date End Date Taking? Authorizing Provider  ?acetaminophen (TYLENOL) 500 MG tablet Take 500 mg by mouth every 6 (six) hours as needed for moderate pain.    [provider]  ?amLODipine (NORVASC) 10 MG tablet Take 10 mg by mouth daily. 06/18/21   [provider]  ?amLODipine (NORVASC) 5 MG tablet Take 1 tablet (5 mg total) by mouth daily. 06/04/21   Lucrezia Starch, MD  ?amoxicillin (AMOXIL) 500 MG capsule Take 2 capsules (1,000 mg total) by mouth 2 (two) times daily. ?Patient not taking: Reported on 06/05/2021 08/09/19   Madilyn Hook A, PA-C  ?azithromycin (ZITHROMAX) 250 MG tablet Take 1 tablet (250 mg total) by mouth daily. Take first 2 tablets together, then 1 every day until finished. 06/06/21   Drenda Freeze, MD  ?dicyclomine (BENTYL) 20 MG tablet Take 1 tablet (20 mg total) by mouth every 6 (six) hours as needed for spasms. ?Patient not taking: Reported on 06/05/2021 08/21/17   Gatha Mayer, MD  ?hydrochlorothiazide (HYDRODIURIL) 12.5 MG tablet Take 12.5 mg by mouth daily. 06/18/21   [provider]  ?levonorgestrel (MIRENA) 20 MCG/24HR IUD 1 each by  Intrauterine route once.    [provider]  ?lidocaine (XYLOCAINE) 2 % solution Use as directed 15 mLs in the mouth or throat as needed for mouth pain. ?Patient not taking: Reported on 06/05/2021 06/14/19   Rodell Perna A, PA-C  ?lisinopril (ZESTRIL) 10 MG tablet Take 10 mg by mouth daily. 06/10/21   [provider]  ?metroNIDAZOLE (FLAGYL) 500 MG tablet Take 500 mg by mouth 2 (two) times daily. 06/30/21   [provider]  ?predniSONE (DELTASONE) 10 MG tablet Take 1 tablet (10 mg total) by mouth daily with breakfast. ?Patient not taking: Reported on 06/05/2021 08/24/17   Gatha Mayer, MD  ?Vitamin D, Ergocalciferol, (DRISDOL) 1.25 MG (50000 UNIT) CAPS capsule Take 50,000 Units by mouth once a week. 06/24/21   [provider]  ?   ? ?Allergies    ?Patient has no known allergies.   ? ?Review of Systems   ?Review of Systems  ?Cardiovascular:  Positive for palpitations.  ?All other systems reviewed and are negative. ? ?Physical Exam ?Updated Vital Signs ?BP (!) 153/107 (BP Location: Left Arm)   Pulse 77   Temp 98 ?F (36.7 ?C)   Resp 16   SpO2 100%  ?Physical Exam ?Vitals reviewed.  ?Constitutional:   ?   Appearance: Normal appearance.  ?HENT:  ?   Head: Normocephalic and atraumatic.  ?   Mouth/Throat:  ?   Mouth: Mucous membranes are  moist.  ?   Comments: Dental decay swelling upper gumline and face  ?Cardiovascular:  ?   Rate and Rhythm: Normal rate.  ?Pulmonary:  ?   Effort: Pulmonary effort is normal.  ?Musculoskeletal:     ?   General: Normal range of motion.  ?Skin: ?   General: Skin is warm.  ?Neurological:  ?   General: No focal deficit present.  ?   Mental Status: She is alert.  ?Psychiatric:     ?   Mood and Affect: Mood normal.  ? ? ?ED Results / Procedures / Treatments   ?Labs ?(all labs ordered are listed, but only abnormal results are displayed) ?Labs Reviewed  ?BASIC METABOLIC PANEL - Abnormal; Notable for the following components:  ?    Result Value  ? Potassium 3.2 (*)    ? All other components within normal limits  ?CBC WITH DIFFERENTIAL/PLATELET - Abnormal; Notable for the following components:  ? Neutro Abs 7.8 (*)   ? All other components within normal limits  ?I-STAT BETA HCG BLOOD, ED ([facility], [facility], [facility] ONLY)  ? ? ?EKG ?None ? ?Radiology ?No results found. ? ?Procedures ?Procedures  ? ? ?Medications Ordered in ED ?Medications - No data to display ? ?ED Course/ Medical Decision Making/ A&P ?  ?                        ?Medical Decision Making ?Pt complains of swelling to right side of her face  ? ?Risk ?Prescription drug management. ? ? ? ? ? ? ? ? ? ? ?Final Clinical Impression(s) / ED Diagnoses ?Final diagnoses:  ?Dental abscess  ? ? ?Rx / DC Orders ?ED Discharge Orders   ? ?      Ordered  ?  clindamycin (CLEOCIN) 300 MG capsule  3 times daily       ? 07/02/21 1411  ?  diclofenac (VOLTAREN) 75 MG EC tablet  2 times daily       ? 07/02/21 1411  ? ?  ?  ? ?  ? ?An After Visit Summary was printed and given to the patient.  ?  ?Fransico Meadow, PA-C ?07/02/21 1412 ? ?  ?Fransico Meadow, [place] ?07/02/21 1451 ? ?  ?Deno Etienne, DO ?07/03/21 1458 ? ?

## 2021-07-03 HISTORY — PX: OTHER SURGICAL HISTORY: SHX169

## 2021-07-05 ENCOUNTER — Ambulatory Visit (INDEPENDENT_AMBULATORY_CARE_PROVIDER_SITE_OTHER): Payer: Medicaid Other

## 2021-07-05 ENCOUNTER — Encounter: Payer: Self-pay | Admitting: Cardiology

## 2021-07-05 ENCOUNTER — Ambulatory Visit (INDEPENDENT_AMBULATORY_CARE_PROVIDER_SITE_OTHER): Payer: Medicaid Other | Admitting: Cardiology

## 2021-07-05 VITALS — BP 116/84 | HR 87 | Ht 65.0 in | Wt 190.8 lb

## 2021-07-05 DIAGNOSIS — R002 Palpitations: Secondary | ICD-10-CM

## 2021-07-05 DIAGNOSIS — R079 Chest pain, unspecified: Secondary | ICD-10-CM

## 2021-07-05 DIAGNOSIS — Z8249 Family history of ischemic heart disease and other diseases of the circulatory system: Secondary | ICD-10-CM | POA: Insufficient documentation

## 2021-07-05 DIAGNOSIS — I10 Essential (primary) hypertension: Secondary | ICD-10-CM | POA: Diagnosis not present

## 2021-07-05 DIAGNOSIS — R072 Precordial pain: Secondary | ICD-10-CM | POA: Insufficient documentation

## 2021-07-05 DIAGNOSIS — K581 Irritable bowel syndrome with constipation: Secondary | ICD-10-CM

## 2021-07-05 DIAGNOSIS — K589 Irritable bowel syndrome without diarrhea: Secondary | ICD-10-CM | POA: Insufficient documentation

## 2021-07-05 MED ORDER — METOPROLOL TARTRATE 50 MG PO TABS: 100.0000 mg | ORAL_TABLET | Freq: Once | ORAL | 0 refills | Status: DC

## 2021-07-05 NOTE — Patient Instructions (Addendum)
Medication Instructions:  ? ? ?*If you need a refill on your cardiac medications before your next appointment, please call your pharmacy* ? ? ?Lab Work: ?See below - cmp,lipid ,hgb a1c  ?If you have labs (blood work) drawn today and your tests are completely normal, you will receive your results only by: ?MyChart Message (if you have MyChart) OR ?A paper copy in the mail ?If you have any lab test that is abnormal or we need to change your treatment, we will call you to review the results. ? ? ?Testing/Procedures: ?Will be mailed to you  ? Your physician has recommended that you wear a holter monitor 7 days Zio . Holter monitors are medical devices that record the heart?s electrical activity. Doctors most often use these monitors to diagnose arrhythmias. Arrhythmias are problems with the speed or rhythm of the heartbeat. The monitor is a small, portable device. You can wear one while you do your normal daily activities. This is usually used to diagnose what is causing palpitations/syncope (passing out). ? ?And ? ?Will be schedule at [facility] at the radiology dept --  will be authorized  by you insurance first ?Your physician has requested that you have cardiac CTA. Cardiac computed tomography (CT) is a painless test that uses an x-ray machine to take clear, detailed pictures of your heart. Please follow instruction sheet as given. ?  ? ?Follow-Up: ?At [facility], you and your health needs are our priority.  As part of our continuing mission to provide you with exceptional heart care, we have created designated Provider Care Teams.  These Care Teams include your primary Cardiologist (physician) and Advanced Practice Providers (APPs -  Physician Assistants and Nurse Practitioners) who all work together to provide you with the care you need, when you need it. ? ?  ? ?Your next appointment:   ?2 month(s) ? ?The format for your next appointment:   ?In Person ? ?Provider:   ?Glenetta Hew, MD  ? ? ?Other  Instructions  ?Marland KitchenZIO XT- Long Term Monitor Instructions ? ?Your physician has requested you wear a ZIO patch monitor for 7 days.  ?This is a single patch monitor. [employer] supplies one patch monitor per enrollment. Additional ?stickers are not available. Please do not apply patch if you will be having a Nuclear Stress Test,  ?Echocardiogram, Cardiac CT, MRI, or Chest Xray during the period you would be wearing the  ?monitor. The patch cannot be worn during these tests. You cannot remove and re-apply the  ?ZIO XT patch monitor.  ?Your ZIO patch monitor will be mailed 3 day USPS to your address on file. It may take 3-5 days  ?to receive your monitor after you have been enrolled.  ?Once you have received your monitor, please review the enclosed instructions. Your monitor  ?has already been registered assigning a specific monitor serial # to you. ? ?Billing and Patient Assistance Program Information ? ?We have supplied [employer] with any of your insurance information on file for billing purposes. ?[employer] offers a sliding scale Patient Assistance Program for patients that do not have  ?insurance, or whose insurance does not completely cover the cost of the ZIO monitor.  ?You must apply for the Patient Assistance Program to qualify for this discounted rate.  ?To apply, please call [employer] at [phone], select option 4, select option 2, ask to apply for  ?Patient Assistance Program. Theodore Demark will ask your household income, and how many people  ?are in your household. They will quote your  out-of-pocket cost based on that information.  ?[employer] will also be able to set up a 3-month interest-free payment plan if needed. ? ?Applying the monitor ?  ?Shave hair from upper left chest.  ?Hold abrader disc by orange tab. Rub abrader in 40 strokes over the upper left chest as  ?indicated in your monitor instructions.  ?Clean area with 4 enclosed alcohol pads. Let dry.  ?Apply patch as indicated in monitor instructions. Patch will  be placed under collarbone on left  ?side of chest with arrow pointing upward.  ?Rub patch adhesive wings for 2 minutes. Remove white label marked "1". Remove the white  ?label marked "2". Rub patch adhesive wings for 2 additional minutes.  ?While looking in a mirror, press and release button in center of patch. A small green light will  ?flash 3-4 times. This will be your only indicator that the monitor has been turned on.  ?Do not shower for the first 24 hours. You may shower after the first 24 hours.  ?Press the button if you feel a symptom. You will hear a small click. Record Date, Time and  ?Symptom in the Patient Logbook.  ?When you are ready to remove the patch, follow instructions on the last 2 pages of Patient  ?Logbook. Stick patch monitor onto the last page of Patient Logbook.  ?Place Patient Logbook in the blue and white box. Use locking tab on box and tape box closed  ?securely. The blue and white box has prepaid postage on it. Please place it in the mailbox as  ?soon as possible. Your physician should have your test results approximately 7 days after the  ?[facility]  ?Call [facility]at [phone] you have questions regarding  ?your ZIO XT patch monitor. Call them immediately if you see an orange light blinking on your  ?monitor.  ?If your monitor falls off in less than 4 days, contact our Monitor department at [phone]  ?If your monitor becomes loose or falls off after 4 days call [employer] at [phone]  ?suggestions on securing your monitor  ? ? ? ? ? ?Your cardiac CT will be scheduled at the below location:  ? ?[facility]?[place] [address]?[phone] ? ? ?[facility] prior to test start time. ?You can use the FREE valet parking offered at entrance C (encouraged to control the heart rate for the  test)  ?[facility] floor) to check-in and test prep. ? ?All radiology patients and guests should use [facility]2 [facility] even though the hospital's physical address listed is [address] ? ? ? ? ? ?Please follow these instructions carefully (unless otherwise directed): ? ?Please your lab work ( CVerdi LIPId, [id number])  a at least week prior to the test  ? ?On the Night Before the Test: ?Be sure to Drink plenty of water. ?Do not consume any caffeinated/decaffeinated beverages or chocolate 12 hours prior to your test. ?Do not take any antihistamines 12 hours prior to your test. ? ? ?On the Day of the Test: ?Drink plenty of water until 1 hour prior to the test. ?Do not eat any food 4 hours prior to the test. ?You may take your regular medications prior to the test.  ?Take metoprolol (Lopressor) 100  mg  two hours prior to test. ?HOLD Hydrochlorothiazide  12.5 mg morning of the test. ?FEMALES- please wear underwire-free bra if available, avoid dresses & tight clothing ? ?     ?After the Test: ?Drink plenty of water. ?After receiving IV contrast, you may experience a mild flushed feeling. This is normal. ?On occasion, you may experience a mild rash up to 24 hours after the test. This is not dangerous. If this occurs, you can take Benadryl 25 mg and increase your fluid intake. ?If you experience trouble breathing, this can be serious. If it is severe call 911 IMMEDIATELY. If it is mild, please call our office. ? ? ?We will call to schedule your test 2-4 weeks out understanding that some insurance companies will need an authorization prior to the service being performed.  ? ?For non-scheduling related questions, please contact the cardiac imaging nurse navigator should you have any questions/concerns: ?Marchia Bond, Cardiac Imaging Nurse Navigator ?Gordy Clement, Cardiac Imaging Nurse Navigator ?[facility] ?Direct Office Dial: [phone]  ? ?For scheduling needs, including cancellations and rescheduling, please call [place], [phone].  ?

## 2021-07-05 NOTE — Progress Notes (Unsigned)
Enrolled for [employer] to mail a ZIO XT long term holter monitor to the patients address on file.  

## 2021-07-05 NOTE — Progress Notes (Signed)
? ? ?Primary Care Provider: Joycelyn Man, FNP ?Cardiologist: Glenetta Hew, MD ?Electrophysiologist: None ? ?Clinic Note: ?Chief Complaint  ?Patient presents with  ? New Patient (Initial Visit)  ?  Heart palpitations, chest pain  ? ?=================================== ? ?ASSESSMENT/PLAN  ? ?Problem List Items Addressed This Visit   ? ?  ? Cardiology Problems  ? Essential hypertension (Chronic)  ?  -year-old woman already with diagnosis of hypertension on the medications.  Significant family history as well for CAD and risk factors. ? ?Thankfully, blood pressure looks pretty well controlled.  Pending results of upcoming studies, may need to switch to consider adding beta-blocker, especially in light of palpitations. ?  ?  ? Relevant Orders  ? EKG 12-Lead  ? Lipid panel  ? Comprehensive metabolic panel  ? Hemoglobin A1c  ?  ? Other  ? Precordial pain - Primary  ?  Difficult to really tease the true nature of this chest discomfort, but it is concerning that she is having symptoms both at rest and with exertion.  She also has some exertional dyspnea.  Certainly all this symptom etiology could be related to her body habitus and former smoking.  However, with her family history, would need to exclude CAD. ? ?Plan: Coronary CTA with possible FFRCT ?  ?  ? Relevant Orders  ? EKG 12-Lead  ? Lipid panel  ? Comprehensive metabolic panel  ? Hemoglobin A1c  ? CT CORONARY MORPH W/CTA COR W/SCORE W/CA W/CM &/OR WO/CM  ? Family history of premature coronary artery disease (Chronic)  ?  Significant family history with significant risk factors hypertension, smoking.   ?She is now noticing an unusual palpitations and chest discomfort. ? ?I am a little bit concerned about this test TAVR since she is about the 28s her brother was when he had his heart attack.  She is obese and already has hypertension and is a long-term smoker. ? ?Would like to get least a baseline segment evaluation.  She indicates that she probably would not walk  that far on I treadmill.  With her body habitus, not sure how accurate my view would be.  As such, I think the best course of action will be continued straight to Coronary CTA.  This will give [place] a baseline assessment of her coronary anatomy for Coronary Calcium Score but also any potential lesions either significant or not.  This will help [place] with management going forward. ? ?We will also recheck lipid panel and chemistries along with A1c for further risk stratification ?  ?  ? Relevant Orders  ? EKG 12-Lead  ? Lipid panel  ? Comprehensive metabolic panel  ? Hemoglobin A1c  ? IBS (irritable bowel syndrome)  ? Relevant Orders  ? Lipid panel  ? Comprehensive metabolic panel  ? Hemoglobin A1c  ? Heart palpitations (Chronic)  ?  Difficult to really tell what she is talking about with his palpitations spells.  Sometimes she feels her heart rate going up sometimes it beating irregular.  Need to determine what is happening.  Patient then to be happening at least once or twice a day. ? ?Plan: 7-day Zio patch monitor ?  ?  ? Relevant Orders  ? EKG 12-Lead  ? Lipid panel  ? Comprehensive metabolic panel  ? Hemoglobin A1c  ? CT CORONARY MORPH W/CTA COR W/SCORE W/CA W/CM &/OR WO/CM  ? LONG TERM MONITOR (3-14 DAYS)  ? ?=================================== ? ?HPI:   ? ?Jocelyn Garcia is a 36 y.o. female smoker (  recently quit) with a PMH notable for longstanding hypertension, and significant family history of premature CAD (brother and mother) who presents today for evaluation of HEART PALPITATIONS, CHEST PAIN, and Near Syncope.  She is being seen today at the request of Joycelyn Man, FNP ? ?Recent Hospitalizations: Multiple ER visits ?05/26/2021: ER visit for viral pharyngitis and hypertension. ?06/04/2021: ER visit for palpitations and lightheadedness ?06/05/2021: Uncontrolled hypertension ?06/06/2021: Bronchitis palpitations, chest pain and shortness of breath => negative troponins, D-dimer mildly elevated.  EKG with borderline  changes (personally reviewed).  No significant change. => Evaluated with a PE protocol CT normal.  Thought to have bronchitis on CT => discharged home on antibiotics (Z-Pak) apparently was referred to to [facility]. => She says he felt better after being on Z-Pak but will need cardiology referral for palpitations based on family history.  Home blood pressures were ranging from the 140s to 170s over 90s to 120s.  She asked for referral to cardiology.Marland Kitchen ?06/17/2021: Nonspecific chest pain ?07/02/2021:  ? ? ?Virgel Gess was seen on June 25, 2021 by Earnestine Leys, FNP in response to several different ER visits.  She wanted to establish care.  She requested cardiology referral for palpitations and elevated blood pressure. ? ? ?Reviewed  CV studies:   ? ?The following studies were reviewed today: (if available, images/films reviewed: From Epic Chart or Care Everywhere) ?None: ? ? ?Interval History:  ? ?Virgel Gess presents here today for cardiology evaluation.  She has a pretty extensive family history of CAD, and now that she is having palpitations, she is a little bit scared.  She had one episode) significant while at work.  She had asked permission to leave a call off from work.  She has had these intermittent episodes where she feels her heart rate will go to the 140s and 150s but seems to be happening when she is not necessarily exercising.  She has not necessarily noted any prolonged episodes of irregular heartbeats, but does still continue to monitor to monitor her heart rate feels some abnormalities.  When she is having a lot of them, she feels lightheaded and dizzy and may be and has chest comfort.  She also has pretty diffuse musculoskeletal pains throughout the precordium.  Not necessarily made worse with activity or exertion. ?Concern has happened up to 3 times a day, and the rest go several days without having them. ?She does state that with these episodes she may or may not have the chest  discomfort but also is having independent episodes where she feels unusual sensation along the left lateral part of her chest wall.  Not necessarily made worse with exertion, it is something she has been having issues with. ?She is also these vague complaints that are somewhat difficult to understand the details.  She may have some swelling.  She also has a little bit of exertional dyspnea when she overdoes it, but has already noticed improvement after quitting smoking a month ago.  She thinks the weird chins chest discomfort episodes also improved since being off cigarettes.  She certainly feels the coughing and the congestion have improved. ? ?As much of the palpitations, she is also concerned about intermittent episodes of chest tightness seems like in her upper part of her throat.  May or may not happen with exertion.  Does not always noticeable palpitations but can ? ?No real PND orthopnea, but does have some mild end of day swelling. ? ? ?CV Review of Symptoms (  Summary) ?Cardiovascular ROS: positive for - chest pain, irregular heartbeat, palpitations, and shortness of breath ?negative for - dyspnea on exertion, loss of consciousness, orthopnea, paroxysmal nocturnal dyspnea, rapid heart rate, shortness of breath, or lightheadedness, dizziness wooziness, syncope/near syncope TIA/amaurosis fugax, claudication ? ?REVIEWED OF SYSTEMS  ? ?Review of Systems  ?Constitutional:  Positive for malaise/fatigue. Negative for weight loss.  ?HENT:  Positive for congestion.   ?Respiratory:  Positive for cough and shortness of breath. Negative for wheezing (Not currently).   ?Cardiovascular:  Positive for leg swelling.  ?Gastrointestinal:  Positive for blood in stool. Negative for melena.  ?Genitourinary:  Negative for frequency and hematuria.  ?Musculoskeletal:  Negative for falls and joint pain.  ?Neurological:  Positive for dizziness and headaches. Negative for tingling and focal weakness.  ?Psychiatric/Behavioral:  Negative  for depression (At least some dysthymia) and memory loss. The patient is not nervous/anxious and does not have insomnia.   ?All other systems reviewed and are negative. ? ?I have reviewed and (if needed) person

## 2021-07-06 ENCOUNTER — Encounter: Payer: Self-pay | Admitting: Cardiology

## 2021-07-06 NOTE — Assessment & Plan Note (Signed)
-  year-old woman already with diagnosis of hypertension on the medications.  Significant family history as well for CAD and risk factors. ? ?Thankfully, blood pressure looks pretty well controlled.  Pending results of upcoming studies, may need to switch to consider adding beta-blocker, especially in light of palpitations. ?

## 2021-07-06 NOTE — Assessment & Plan Note (Signed)
Difficult to really tease the true nature of this chest discomfort, but it is concerning that she is having symptoms both at rest and with exertion.  She also has some exertional dyspnea.  Certainly all this symptom etiology could be related to her body habitus and former smoking.  However, with her family history, would need to exclude CAD. ? ?Plan: Coronary CTA with possible FFRCT ?

## 2021-07-06 NOTE — Assessment & Plan Note (Signed)
Difficult to really tell what she is talking about with his palpitations spells.  Sometimes she feels her heart rate going up sometimes it beating irregular.  Need to determine what is happening.  Patient then to be happening at least once or twice a day. ? ?Plan: 7-day Zio patch monitor ?

## 2021-07-06 NOTE — Assessment & Plan Note (Signed)
Significant family history with significant risk factors hypertension, smoking.   ?She is now noticing an unusual palpitations and chest discomfort. ? ?I am a little bit concerned about this test TAVR since she is about the 35s her brother was when he had his heart attack.  She is obese and already has hypertension and is a long-term smoker. ? ?Would like to get least a baseline segment evaluation.  She indicates that she probably would not walk that far on I treadmill.  With her body habitus, not sure how accurate my view would be.  As such, I think the best course of action will be continued straight to Coronary CTA.  This will give [place] a baseline assessment of her coronary anatomy for Coronary Calcium Score but also any potential lesions either significant or not.  This will help [place] with management going forward. ? ?We will also recheck lipid panel and chemistries along with A1c for further risk stratification ?

## 2021-07-08 DIAGNOSIS — R079 Chest pain, unspecified: Secondary | ICD-10-CM

## 2021-07-08 DIAGNOSIS — R002 Palpitations: Secondary | ICD-10-CM | POA: Diagnosis not present

## 2021-07-09 ENCOUNTER — Encounter: Payer: Self-pay | Admitting: Cardiology

## 2021-07-14 ENCOUNTER — Encounter: Payer: Self-pay | Admitting: Cardiology

## 2021-07-14 ENCOUNTER — Telehealth (HOSPITAL_COMMUNITY): Payer: Self-pay | Admitting: Emergency Medicine

## 2021-07-14 NOTE — Telephone Encounter (Signed)
Reaching out to patient to offer assistance regarding upcoming cardiac imaging study; pt verbalizes understanding of appt date/time, parking situation and where to check in, pre-test NPO status and medications ordered, and verified current allergies; name and call back number provided for further questions should they arise ?Marchia Bond RN Navigator Cardiac Imaging ?[facility] ?[phone] office ?[phone] cell ? ?Pt aware that pre- cert still working on [place] for tomorrows test- I told her I didn't want her hit with a big bill if we can help it, she said she was going to conitnue to pray about it.  ?37m metoprolol tart - holding HCTZ ?Arrival 400 ? ?

## 2021-07-14 NOTE — Telephone Encounter (Signed)
If the Scan is abnormal - we will have her come back sooner.  OTW 2 months.  Ivin Booty may not have made the appt waiting for the results. ? ?Glenetta Hew, MD ? ?

## 2021-07-15 ENCOUNTER — Ambulatory Visit (HOSPITAL_COMMUNITY)
Admission: RE | Admit: 2021-07-15 | Discharge: 2021-07-15 | Disposition: A | Discharge status: Home / Self Care | Payer: Medicaid Other | Source: Ambulatory Visit | Attending: Cardiology | Admitting: Cardiology

## 2021-07-15 DIAGNOSIS — R002 Palpitations: Secondary | ICD-10-CM | POA: Insufficient documentation

## 2021-07-15 DIAGNOSIS — R072 Precordial pain: Secondary | ICD-10-CM | POA: Diagnosis not present

## 2021-07-15 MED ORDER — NITROGLYCERIN 0.4 MG SL SUBL
SUBLINGUAL_TABLET | SUBLINGUAL | Status: AC
  Filled 2021-07-15: qty 2

## 2021-07-15 MED ORDER — NITROGLYCERIN 0.4 MG SL SUBL
0.8000 mg | SUBLINGUAL_TABLET | Freq: Once | SUBLINGUAL | Status: AC
Start: 2021-07-15 — End: 2021-07-15
  Administered 2021-07-15: 0.8 mg via SUBLINGUAL

## 2021-07-15 MED ORDER — IOHEXOL 350 MG/ML SOLN
95.0000 mL | Freq: Once | INTRAVENOUS | Status: AC | PRN
  Administered 2021-07-15: 95 mL via INTRAVENOUS

## 2021-07-21 LAB — COMPREHENSIVE METABOLIC PANEL
ALT: 17 IU/L (ref 0–32)
AST: 14 IU/L (ref 0–40)
Albumin/Globulin Ratio: 1.8 (ref 1.2–2.2)
Albumin: 4.6 g/dL (ref 3.8–4.8)
Alkaline Phosphatase: 57 IU/L (ref 44–121)
BUN/Creatinine Ratio: 9 (ref 9–23)
BUN: 6 mg/dL (ref 6–20)
Bilirubin Total: 0.5 mg/dL (ref 0.0–1.2)
Calcium: 9.4 mg/dL (ref 8.7–10.2)
Chloride: 103 mmol/L (ref 96–106)
Creatinine, Ser: 0.67 mg/dL (ref 0.57–1.00)
Globulin, Total: 2.5 g/dL (ref 1.5–4.5)
Glucose: 83 mg/dL (ref 70–99)
Potassium: 3.8 mmol/L (ref 3.5–5.2)
Sodium: 140 mmol/L (ref 134–144)
Total Protein: 7.1 g/dL (ref 6.0–8.5)
eGFR: 117 mL/min/{1.73_m2} (ref >59)

## 2021-07-21 LAB — LIPID PANEL
Chol/HDL Ratio: 4.4 ratio (ref 0.0–4.4)
Cholesterol, Total: 194 mg/dL (ref 100–199)
HDL: 44 mg/dL (ref >39)
LDL Chol Calc (NIH): 138 mg/dL — ABNORMAL HIGH (ref 0–99)
Triglycerides: 67 mg/dL (ref 0–149)
VLDL Cholesterol Cal: 12 mg/dL (ref 5–40)

## 2021-07-21 LAB — HEMOGLOBIN A1C
Est. average glucose Bld gHb Est-mCnc: 105 mg/dL
Hgb A1c MFr Bld: 5.3 % (ref 4.8–5.6)

## 2021-07-26 DIAGNOSIS — K219 Gastro-esophageal reflux disease without esophagitis: Secondary | ICD-10-CM | POA: Insufficient documentation

## 2021-08-23 ENCOUNTER — Ambulatory Visit: Payer: Medicaid Other | Admitting: Cardiology

## 2021-08-23 ENCOUNTER — Ambulatory Visit (INDEPENDENT_AMBULATORY_CARE_PROVIDER_SITE_OTHER): Payer: Medicaid Other | Admitting: Cardiology

## 2021-08-23 ENCOUNTER — Encounter: Payer: Self-pay | Admitting: Cardiology

## 2021-08-23 VITALS — BP 122/80 | HR 90 | Ht 65.0 in | Wt 200.0 lb

## 2021-08-23 DIAGNOSIS — R072 Precordial pain: Secondary | ICD-10-CM

## 2021-08-23 DIAGNOSIS — Z8249 Family history of ischemic heart disease and other diseases of the circulatory system: Secondary | ICD-10-CM

## 2021-08-23 DIAGNOSIS — R002 Palpitations: Secondary | ICD-10-CM | POA: Diagnosis not present

## 2021-08-23 DIAGNOSIS — I10 Essential (primary) hypertension: Secondary | ICD-10-CM | POA: Diagnosis not present

## 2021-08-23 DIAGNOSIS — E7849 Other hyperlipidemia: Secondary | ICD-10-CM

## 2021-08-23 DIAGNOSIS — E785 Hyperlipidemia, unspecified: Secondary | ICD-10-CM

## 2021-08-23 MED ORDER — ROSUVASTATIN CALCIUM 20 MG PO TABS: 20.0000 mg | ORAL_TABLET | Freq: Every day | ORAL | 3 refills | Status: AC

## 2021-08-23 NOTE — Progress Notes (Signed)
Primary Care Provider: Joycelyn Man, FNP Cardiologist: Glenetta Hew, MD Electrophysiologist: None  Clinic Note: Chief Complaint  Patient presents with   Follow-up    Test results-Coronary CTA and Zio patch monitor   ===================================  ASSESSMENT/PLAN   Problem List Items Addressed This Visit       Cardiology Problems   Essential hypertension (Chronic)    Blood pressure looks pretty well controlled. Continue current dose of amlodipine and HCTZ.       Relevant Medications   hydrochlorothiazide (HYDRODIURIL) 25 MG tablet   rosuvastatin (CRESTOR) 20 MG tablet   Other Relevant Orders   Lipid panel   Comprehensive metabolic panel   Lipid panel   Comprehensive metabolic panel   Hyperlipidemia due to dietary fat intake (Chronic)    Relatively low Coronary Calcium Score and 0.  However she has a family history of CAD.  Target LDL should be less than 100.  Plan: Initiate rosuvastatin 20 mg daily.  Recheck labs in 6 months and then again in a year.       Relevant Medications   hydrochlorothiazide (HYDRODIURIL) 25 MG tablet   rosuvastatin (CRESTOR) 20 MG tablet     Other   Precordial pain (Chronic)    Family history noted for CAD, definitive evaluation with coronary CTA revealed minimal CAD with coronary calcium score of 0.  Simply because of her family history, I do think we should probably initiate therapy for her poorly controlled lipids. Start rosuvastatin 20 mg daily.        Family history of premature coronary artery disease - Primary (Chronic)    Thankfully, her Coronary Calcium Score was as hoped, relatively normal.  She does have pretty significant family history and I do think we should trigger LDL less than 70 as well as adequate blood pressure control.  Monitor glucose levels.  Plan: We will start rosuvastatin 20 mg daily and recheck labs in 6 months. Continue amlodipine and HCTZ for blood pressure control.       Relevant Orders    Lipid panel   Comprehensive metabolic panel   Lipid panel   Comprehensive metabolic panel   Heart palpitations (Chronic)    Very difficult to explain her symptoms.  Monitor did not show anything significant.  Overall symptoms seem to have improved.  But have a low threshold to use rate control agent such as beta-blocker however if symptoms recur.  She does have blood pressure room to tolerate likely bisoprolol or Lopressor.       Relevant Orders   Lipid panel   Comprehensive metabolic panel   Lipid panel   Comprehensive metabolic panel   Other Visit Diagnoses     Hyperlipidemia LDL goal <70       Relevant Medications   hydrochlorothiazide (HYDRODIURIL) 25 MG tablet   rosuvastatin (CRESTOR) 20 MG tablet   Other Relevant Orders   Lipid panel   Comprehensive metabolic panel   Lipid panel   Comprehensive metabolic panel     ===================================  HPI:    Jocelyn Garcia is a 36 y.o. female smoker (recently quit) with a PMH notable for longstanding hypertension, and significant family history of premature CAD (brother and mother) who presents today for FOLLOW-UP EVALUATION of HEART PALPITATIONS, CHEST PAIN, and Near Syncope.  She is being seen today at the request of Joycelyn Man, FNP To discuss results of her tests.  Recent Hospitalizations: Multiple ER visits 05/26/2021: ER visit for viral pharyngitis and hypertension. 06/04/2021: ER visit  for palpitations and lightheadedness 06/05/2021: Uncontrolled hypertension 06/06/2021: Bronchitis palpitations, chest pain and shortness of breath => negative troponins, D-dimer mildly elevated.  EKG with borderline changes (personally reviewed).  No significant change. => Evaluated with a PE protocol CT normal.  Thought to have bronchitis on CT => discharged home on antibiotics (Z-Pak) apparently was referred to to [facility] => She says he felt better after being on Z-Pak but will need cardiology referral for palpitations  based on family history.  Home blood pressures were ranging from the 140s to 170s over 90s to 120s.  She asked for referral to cardiology.. 06/17/2021: Nonspecific chest pain 07/02/2021: Dental abscess  TAKEELA PEIL was seen on June 25, 2021 by Earnestine Leys, FNP in response to several different ER visits.  She wanted to establish care.  She requested cardiology referral for palpitations and elevated blood pressure.  She was seen on June 04, 2021 for additional consultation.  She had a pretty extensive family history of CAD as well as palpitations.  She was very concerned.  She felt her heart rate going to the 140s of 150s at times.  Symptoms associated with lightheadedness/dizziness.  She noted fatigue, congestion, leg swelling. Ordered 7-day ZIO patch monitor and Coronary CTA   Reviewed  CV studies:    The following studies were reviewed today: (if available, images/films reviewed: From Epic Chart or Care Everywhere) Coronary CTA 07/16/2011: Calcium score 0.  Normal coronaries.  Minimal to no evidence of CAD.  Normal cavity sizes.  Zio patch monitor:  Predominant sinus, heart rate range 55-160 bpm, average 89 bpm.   Rare PACs and PVCs with some couplets.  ->  Symptomatic PACs. 1 short atrial bursts heart rate range 93 to 122 bpm.  Beats-average 107 bpm.  Interval History:   IRISH BREISCH presents here today for follow-up to discuss results of her studies.  She was very happy to hear the results of her Coronary CTA.  She also says that she really has not been having that much in the way of any of the really fast heart rate spells since her last visit.  Pretty much, since she took the metoprolol for her CT scan, she says that things have calmed down.  She has made a dramatic effort to cut down her caffeine and sweet intake. No longer having any chest tightness or pressure with rest or exertion. No syncope/near syncope or TIA/amaurosis fugax, claudication.  No PND, orthopnea with only  trivial end of day swelling.  If she is on her feet a lot.  CV Review of Symptoms (Summary) Cardiovascular ROS: positive for - irregular heartbeat and palpitations negative for - chest pain, dyspnea on exertion, irregular heartbeat, loss of consciousness, orthopnea, palpitations, paroxysmal nocturnal dyspnea, rapid heart rate, shortness of breath, or lightheadedness, dizziness wooziness, syncope/near syncope TIA/amaurosis fugax, claudication => Has had a notable improvement  REVIEWED OF SYSTEMS   Review of Systems  Constitutional:  Positive for malaise/fatigue (Still little deconditioned, but trying to be more active.). Negative for weight loss.  HENT:  Positive for congestion.   Respiratory:  Positive for cough and shortness of breath (Improving). Negative for wheezing (Not currently).   Cardiovascular:  Positive for leg swelling (Trivial, end of day).  Gastrointestinal:  Negative for blood in stool and melena.  Genitourinary:  Negative for frequency and hematuria.  Musculoskeletal:  Negative for falls and joint pain.  Neurological:  Positive for dizziness. Negative for tingling, focal weakness and headaches.  Psychiatric/Behavioral:  Negative  for depression (At least some dysthymia) and memory loss. The patient is not nervous/anxious and does not have insomnia.   All other systems reviewed and are negative.  I have reviewed and (if needed) personally updated the patient's problem list, medications, allergies, past medical and surgical history, social and family history.   PAST MEDICAL HISTORY   Past Medical History:  Diagnosis Date   Anemia    Anxiety    Asthma    exercise induced   Chronic sinusitis with recurrent bronchitis    Related to smoking   Crohn's disease of small intestine with intestinal obstruction ([place]) 06/26/2017   Hypertension    Kidney stones    Migraines    Tobacco use disorder, mild, in early remission, abuse 06/05/2021   She says that she quit smoking early  March 2023.  She smoked about 7 years, but before that had 10-year break from smoking.  She smoked as a teenager   Urinary tract infection     PAST SURGICAL HISTORY   Past Surgical History:  Procedure Laterality Date   CT CTA CORONARY W/CA SCORE W/CM &/OR WO/CM  07/16/2011   Calcium score 0.  Normal coronaries.  Minimal to no evidence of CAD.  Normal cavity sizes   WISDOM TOOTH EXTRACTION     Zio patch monitor  07/2021   Predominant sinus rhythm.  Heart rate range 55-160 bpm, average 89 bpm.  Rare isolated PACs and PVCs with some couplets.  Somewhat symptomatic.  1 short atrial run heart rate range 93 to 122 bpm.    Immunization History  Administered Date(s) Administered   MMR 08/01/2011   Tdap 07/31/2011, 10/10/2012    MEDICATIONS/ALLERGIES   Current Meds  Medication Sig   amLODipine (NORVASC) 10 MG tablet Take 10 mg by mouth daily.   hydrochlorothiazide (HYDRODIURIL) 25 MG tablet Take 25 mg by mouth daily.   rosuvastatin (CRESTOR) 20 MG tablet Take 1 tablet (20 mg total) by mouth daily.   Vitamin D, Ergocalciferol, (DRISDOL) 1.25 MG (50000 UNIT) CAPS capsule Take 50,000 Units by mouth once a week.    No Known Allergies  SOCIAL HISTORY/FAMILY HISTORY   Reviewed in Epic:  Pertinent findings:  Social History   Tobacco Use   Smoking status: Former    Packs/day: 0.50    Years: 10.00    Pack years: 5.00    Types: Cigarettes    Quit date: 06/05/2021    Years since quitting: 0.2   Smokeless tobacco: Never  Substance Use Topics   Alcohol use: No   Drug use: No    Comment: over a year ago   Social History   Social History Narrative   She is single, 2 daughters one born in 2014 1 born in 2013 the oldest is in kindergarten.     2 caffeinated beverages daily and she does smoke a half a pack a day.  => Quit smoking in March 2023.   => Smoked from age [age] then again from [age]   Daughter's father is incarcerated.\   Currently uninsured - 05/10/2017       OBJCTIVE -PE,  EKG, labs   Wt Readings from Last 3 Encounters:  08/23/21 200 lb (90.7 kg)  07/05/21 190 lb 12.8 oz (86.5 kg)  06/06/21 210 lb (95.3 kg)    Physical Exam: BP 122/80   Pulse 90   Ht _0  (1.651 m)   Wt 200 lb (90.7 kg)   SpO2 99%   BMI 33.28 kg/m  Physical  Exam Vitals reviewed.  Constitutional:      Appearance: Normal appearance.  HENT:     Head: Normocephalic and atraumatic.  Cardiovascular:     Pulses: Normal pulses.     Heart sounds: Normal heart sounds.    No gallop.  Pulmonary:     Effort: Pulmonary effort is normal. No respiratory distress.     Breath sounds: Normal breath sounds. No rales.  Chest:     Chest wall: No tenderness.  Musculoskeletal:        General: No swelling. Normal range of motion.  Skin:    General: Skin is warm and dry.  Neurological:     General: No focal deficit present.     Mental Status: She is alert and oriented to person, place, and time.     Cranial Nerves: No cranial nerve deficit.     Gait: Gait normal.  Psychiatric:        Mood and Affect: Mood normal.        Behavior: Behavior normal.        Thought Content: Thought content normal.        Judgment: Judgment normal.     Comments: Notably less anxious     Adult ECG Report Not checked  Recent Labs:  reviewed.  Lab Results  Component Value Date   CHOL 194 07/08/2021   HDL 44 07/08/2021   LDLCALC 138 (H) 07/08/2021   TRIG 67 07/08/2021   CHOLHDL 4.4 07/08/2021   Lab Results  Component Value Date   CREATININE 0.67 07/08/2021   BUN 6 07/08/2021   NA 140 07/08/2021   K 3.8 07/08/2021   CL 103 07/08/2021   CO2 CANCELED 07/08/2021      Latest Ref Rng & Units 07/02/2021   10:01 AM 06/06/2021    3:57 PM 06/06/2021    3:50 PM  CBC  WBC 4.0 - 10.5 K/uL 10.3    11.1    Hemoglobin 12.0 - 15.0 g/dL 13.3   12.9   12.3    Hematocrit 36.0 - 46.0 % 40.9   38.0   38.9    Platelets 150 - 400 K/uL 288    245      Lab Results  Component Value Date   HGBA1C 5.3 07/08/2021   Lab  Results  Component Value Date   TSH 2.570 06/05/2021    ==================================================  COVID-19 Education: The signs and symptoms of COVID-19 were discussed with the patient and how to seek care for testing (follow up with PCP or arrange E-visit).    I spent a total of 24 minutes with the patient spent in direct patient consultation.  Additional time spent with chart review  / charting (studies, outside notes, etc): 18 min Total Time: 42 min  Current medicines are reviewed at length with the patient today.  (+/- concerns) N/A  This visit occurred during the SARS-CoV-2 public health emergency.  Safety protocols were in place, including screening questions prior to the visit, additional usage of staff PPE, and extensive cleaning of exam room while observing appropriate contact time as indicated for disinfecting solutions.  Notice: This dictation was prepared with Dragon dictation along with smart phrase technology. Any transcriptional errors that result from this process are unintentional and may not be corrected upon review.  Studies Ordered:   Orders Placed This Encounter  Procedures   Lipid panel   Comprehensive metabolic panel   Lipid panel   Comprehensive metabolic panel   Meds ordered this encounter  Medications   rosuvastatin (CRESTOR) 20 MG tablet    Sig: Take 1 tablet (20 mg total) by mouth daily.    Dispense:  90 tablet    Refill:  3     Patient Instructions / Medication Changes & Studies & Tests Ordered   Patient Instructions  Medication Instructions:   Start taking Rosuvastatin 20 mg one tablet a day.  *If you need a refill on your cardiac medications before your next appointment, please call your pharmacy*   Lab Work:  Labs - 6 month ( Nov 2023) fasting CMP Lipid  Labs in 12 month - fasting ( May 2023)  CMP Lipid  If you have labs (blood work) drawn today and your tests are completely normal, you will receive your results only  by: MyChart Message (if you have MyChart) OR A paper copy in the mail If you have any lab test that is abnormal or we need to change your treatment, we will call you to review the results.   Testing/Procedures:  Not needed  Follow-Up: At [facility], you and your health needs are our priority.  As part of our continuing mission to provide you with exceptional heart care, we have created designated Provider Care Teams.  These Care Teams include your primary Cardiologist (physician) and Advanced Practice Providers (APPs -  Physician Assistants and Nurse Practitioners) who all work together to provide you with the care you need, when you need it.     Your next appointment:   12 month(s)  The format for your next appointment:   In Person  Provider:   Glenetta Hew, MD         Glenetta Hew, M.D., M.S. Interventional Cardiologist   Pager # [phone] Phone # [phone] [address] [place], [place] [address]   Thank you for choosing Heartcare at [facility]!!

## 2021-08-23 NOTE — Patient Instructions (Addendum)
Medication Instructions:   Start taking Rosuvastatin 20 mg one tablet a day.  *If you need a refill on your cardiac medications before your next appointment, please call your pharmacy*   Lab Work:  Labs - 6 month ( Nov 2023) fasting CMP Lipid  Labs in 12 month - fasting ( May 2023)  CMP Lipid  If you have labs (blood work) drawn today and your tests are completely normal, you will receive your results only by: MyChart Message (if you have MyChart) OR A paper copy in the mail If you have any lab test that is abnormal or we need to change your treatment, we will call you to review the results.   Testing/Procedures:  Not needed  Follow-Up: At [facility], you and your health needs are our priority.  As part of our continuing mission to provide you with exceptional heart care, we have created designated Provider Care Teams.  These Care Teams include your primary Cardiologist (physician) and Advanced Practice Providers (APPs -  Physician Assistants and Nurse Practitioners) who all work together to provide you with the care you need, when you need it.     Your next appointment:   12 month(s)  The format for your next appointment:   In Person  Provider:   Glenetta Hew, MD

## 2021-08-26 DIAGNOSIS — E782 Mixed hyperlipidemia: Secondary | ICD-10-CM | POA: Insufficient documentation

## 2021-08-30 ENCOUNTER — Encounter: Payer: Self-pay | Admitting: Cardiology

## 2021-08-30 DIAGNOSIS — E7849 Other hyperlipidemia: Secondary | ICD-10-CM | POA: Insufficient documentation

## 2021-08-30 NOTE — Assessment & Plan Note (Signed)
Blood pressure looks pretty well controlled. Continue current dose of amlodipine and HCTZ.

## 2021-08-30 NOTE — Assessment & Plan Note (Signed)
Relatively low Coronary Calcium Score and 0.  However she has a family history of CAD.  Target LDL should be less than 100.  Plan: Initiate rosuvastatin 20 mg daily.  Recheck labs in 6 months and then again in a year.

## 2021-08-30 NOTE — Assessment & Plan Note (Signed)
Thankfully, her Coronary Calcium Score was as hoped, relatively normal.  She does have pretty significant family history and I do think we should trigger LDL less than 70 as well as adequate blood pressure control.  Monitor glucose levels.  Plan: We will start rosuvastatin 20 mg daily and recheck labs in 6 months. Continue amlodipine and HCTZ for blood pressure control.

## 2021-08-30 NOTE — Assessment & Plan Note (Signed)
Family history noted for CAD, definitive evaluation with coronary CTA revealed minimal CAD with coronary calcium score of 0.  Simply because of her family history, I do think we should probably initiate therapy for her poorly controlled lipids. Start rosuvastatin 20 mg daily.

## 2021-08-30 NOTE — Assessment & Plan Note (Signed)
Very difficult to explain her symptoms.  Monitor did not show anything significant.  Overall symptoms seem to have improved.  But have a low threshold to use rate control agent such as beta-blocker however if symptoms recur.  She does have blood pressure room to tolerate likely bisoprolol or Lopressor.

## 2022-01-15 ENCOUNTER — Emergency Department (HOSPITAL_COMMUNITY)
Admission: EM | Admit: 2022-01-15 | Discharge: 2022-01-16 | Discharge status: Against Medical Advice | Payer: Medicaid Other | Attending: Student | Admitting: Student

## 2022-01-15 ENCOUNTER — Emergency Department (HOSPITAL_COMMUNITY): Payer: Medicaid Other

## 2022-01-15 ENCOUNTER — Encounter (HOSPITAL_COMMUNITY): Payer: Self-pay | Admitting: *Deleted

## 2022-01-15 ENCOUNTER — Other Ambulatory Visit: Payer: Self-pay

## 2022-01-15 DIAGNOSIS — Z5321 Procedure and treatment not carried out due to patient leaving prior to being seen by health care provider: Secondary | ICD-10-CM | POA: Insufficient documentation

## 2022-01-15 DIAGNOSIS — R110 Nausea: Secondary | ICD-10-CM | POA: Diagnosis not present

## 2022-01-15 DIAGNOSIS — I10 Essential (primary) hypertension: Secondary | ICD-10-CM | POA: Insufficient documentation

## 2022-01-15 LAB — COMPREHENSIVE METABOLIC PANEL
ALT: 18 U/L (ref 0–44)
AST: 17 U/L (ref 15–41)
Albumin: 3.9 g/dL (ref 3.5–5.0)
Alkaline Phosphatase: 40 U/L (ref 38–126)
Anion gap: 8 (ref 5–15)
BUN: 12 mg/dL (ref 6–20)
CO2: 27 mmol/L (ref 22–32)
Calcium: 9.2 mg/dL (ref 8.9–10.3)
Chloride: 103 mmol/L (ref 98–111)
Creatinine, Ser: 0.7 mg/dL (ref 0.44–1.00)
GFR, Estimated: >60 mL/min (ref >60)
Glucose, Bld: 136 mg/dL — ABNORMAL HIGH (ref 70–99)
Potassium: 3.2 mmol/L — ABNORMAL LOW (ref 3.5–5.1)
Sodium: 138 mmol/L (ref 135–145)
Total Bilirubin: 0.6 mg/dL (ref 0.3–1.2)
Total Protein: 6.9 g/dL (ref 6.5–8.1)

## 2022-01-15 LAB — CBC WITH DIFFERENTIAL/PLATELET
Abs Immature Granulocytes: 0.02 10*3/uL (ref 0.00–0.07)
Basophils Absolute: 0 10*3/uL (ref 0.0–0.1)
Basophils Relative: 0 %
Eosinophils Absolute: 0.1 10*3/uL (ref 0.0–0.5)
Eosinophils Relative: 1 %
HCT: 35.3 % — ABNORMAL LOW (ref 36.0–46.0)
Hemoglobin: 11.7 g/dL — ABNORMAL LOW (ref 12.0–15.0)
Immature Granulocytes: 0 %
Lymphocytes Relative: 15 %
Lymphs Abs: 1.3 10*3/uL (ref 0.7–4.0)
MCH: 28 pg (ref 26.0–34.0)
MCHC: 33.1 g/dL (ref 30.0–36.0)
MCV: 84.4 fL (ref 80.0–100.0)
Monocytes Absolute: 0.5 10*3/uL (ref 0.1–1.0)
Monocytes Relative: 6 %
Neutro Abs: 6.7 10*3/uL (ref 1.7–7.7)
Neutrophils Relative %: 78 %
Platelets: 279 10*3/uL (ref 150–400)
RBC: 4.18 MIL/uL (ref 3.87–5.11)
RDW: 13.2 % (ref 11.5–15.5)
WBC: 8.6 10*3/uL (ref 4.0–10.5)
nRBC: 0 % (ref 0.0–0.2)

## 2022-01-15 LAB — I-STAT BETA HCG BLOOD, ED (MC, WL, AP ONLY): I-stat hCG, quantitative: <5 m[IU]/mL (ref <5)

## 2022-01-15 LAB — TROPONIN I (HIGH SENSITIVITY): Troponin I (High Sensitivity): <2 ng/L (ref <18)

## 2022-01-15 NOTE — ED Provider Triage Note (Signed)
Emergency Medicine Provider Triage Evaluation Note  Jocelyn Garcia , a 36 y.o. female  was evaluated in triage.  Pt complains of sensation of feeling that her face was flushed, the back of her head was having pressure and she states that she could not stop shaking.  She states it happened earlier in the day and again this evening prompting EMS call at which time they noted her to be severely hypertensive with BP 218/120.  Endorses compliance with her amlodipine and HCTZ..  Review of Systems  Positive: As above, nausea Negative: Chest pain shortness of breath, syncope, blurry double vision  Physical Exam  BP 114/69 (BP Location: Right Arm)   Pulse 85   Temp 98.9 F (37.2 C)   Resp 18   SpO2 95%  Gen:   Awake, no distress   Resp:  Normal effort  MSK:   Moves extremities without difficulty  Other:  Patient appears somewhat somnolent but without focal deficit on brief neuro exam in triage.  NIH of 0 at this time.  RRR no M/G.  Lungs CTA B.  PERRL, EOMI  Medical Decision Making  Medically screening exam initiated at 10:52 PM.  Appropriate orders placed.  Virgel Gess was informed that the remainder of the evaluation will be completed by another provider, this initial triage assessment does not replace that evaluation, and the importance of remaining in the ED until their evaluation is complete.  This chart was dictated using voice recognition software, Dragon. Despite the best efforts of this provider to proofread and correct errors, errors may still occur which can change documentation meaning.    Emeline Darling, PA-C 01/15/22 2258

## 2022-01-15 NOTE — ED Triage Notes (Signed)
Pt came via EMS with complaints of HTN. For EMS BP 218/120 then 172/106. Pt states she was feeling flushed and shaking but that has since stopped. She just feels foggy headed now.  Pt denies chest pain or headache. Only pain is in her right upper tooth. Pt has htn and took amlodipine and HCTZ this am.

## 2022-01-16 LAB — URINALYSIS, ROUTINE W REFLEX MICROSCOPIC
Bacteria, UA: NONE SEEN
Bilirubin Urine: NEGATIVE
Glucose, UA: NEGATIVE mg/dL
Ketones, ur: NEGATIVE mg/dL
Leukocytes,Ua: NEGATIVE
Nitrite: NEGATIVE
Protein, ur: NEGATIVE mg/dL
Specific Gravity, Urine: 1.014 (ref 1.005–1.030)
pH: 6 (ref 5.0–8.0)

## 2022-01-16 LAB — TROPONIN I (HIGH SENSITIVITY): Troponin I (High Sensitivity): 3 ng/L (ref <18)

## 2022-01-16 NOTE — ED Notes (Signed)
Pt witness leaving ED by getting into vehicle.

## 2022-01-16 NOTE — ED Notes (Signed)
Pt name was call 3x for blood draw with no response

## 2022-01-17 ENCOUNTER — Encounter: Payer: Self-pay | Admitting: Cardiology

## 2022-01-17 NOTE — Telephone Encounter (Signed)
Spoke to patient she stated she has appointment with PCP this Friday 10/20.Stated she will call back If he wants her to see Dr.Harding sooner.

## 2022-03-07 DIAGNOSIS — E559 Vitamin D deficiency, unspecified: Secondary | ICD-10-CM | POA: Insufficient documentation

## 2022-03-07 DIAGNOSIS — G8929 Other chronic pain: Secondary | ICD-10-CM | POA: Insufficient documentation

## 2023-03-30 ENCOUNTER — Emergency Department (HOSPITAL_COMMUNITY): Payer: Medicaid Other

## 2023-03-30 ENCOUNTER — Emergency Department (HOSPITAL_COMMUNITY)
Admission: EM | Admit: 2023-03-30 | Discharge: 2023-03-30 | Discharge status: Against Medical Advice | Payer: Medicaid Other | Attending: Emergency Medicine | Admitting: Emergency Medicine

## 2023-03-30 ENCOUNTER — Encounter (HOSPITAL_COMMUNITY): Payer: Self-pay

## 2023-03-30 ENCOUNTER — Other Ambulatory Visit: Payer: Self-pay

## 2023-03-30 DIAGNOSIS — R42 Dizziness and giddiness: Secondary | ICD-10-CM | POA: Insufficient documentation

## 2023-03-30 DIAGNOSIS — R202 Paresthesia of skin: Secondary | ICD-10-CM | POA: Insufficient documentation

## 2023-03-30 DIAGNOSIS — R079 Chest pain, unspecified: Secondary | ICD-10-CM | POA: Diagnosis not present

## 2023-03-30 DIAGNOSIS — Z5321 Procedure and treatment not carried out due to patient leaving prior to being seen by health care provider: Secondary | ICD-10-CM | POA: Diagnosis not present

## 2023-03-30 DIAGNOSIS — R251 Tremor, unspecified: Secondary | ICD-10-CM | POA: Insufficient documentation

## 2023-03-30 LAB — COMPREHENSIVE METABOLIC PANEL WITH GFR
ALT: 24 U/L (ref 0–44)
AST: 20 U/L (ref 15–41)
Albumin: 4 g/dL (ref 3.5–5.0)
Alkaline Phosphatase: 51 U/L (ref 38–126)
Anion gap: 7 (ref 5–15)
BUN: 9 mg/dL (ref 6–20)
CO2: 25 mmol/L (ref 22–32)
Calcium: 8.7 mg/dL — ABNORMAL LOW (ref 8.9–10.3)
Chloride: 102 mmol/L (ref 98–111)
Creatinine, Ser: 0.68 mg/dL (ref 0.44–1.00)
GFR, Estimated: >60 mL/min (ref >60)
Glucose, Bld: 105 mg/dL — ABNORMAL HIGH (ref 70–99)
Potassium: 3.1 mmol/L — ABNORMAL LOW (ref 3.5–5.1)
Sodium: 134 mmol/L — ABNORMAL LOW (ref 135–145)
Total Bilirubin: 0.6 mg/dL (ref <1.2)
Total Protein: 7.4 g/dL (ref 6.5–8.1)

## 2023-03-30 LAB — CBC WITH DIFFERENTIAL/PLATELET
Abs Immature Granulocytes: 0.04 10*3/uL (ref 0.00–0.07)
Basophils Absolute: 0 10*3/uL (ref 0.0–0.1)
Basophils Relative: 0 %
Eosinophils Absolute: 0.1 10*3/uL (ref 0.0–0.5)
Eosinophils Relative: 1 %
HCT: 37.5 % (ref 36.0–46.0)
Hemoglobin: 12.4 g/dL (ref 12.0–15.0)
Immature Granulocytes: 0 %
Lymphocytes Relative: 14 %
Lymphs Abs: 1.5 10*3/uL (ref 0.7–4.0)
MCH: 27.8 pg (ref 26.0–34.0)
MCHC: 33.1 g/dL (ref 30.0–36.0)
MCV: 84.1 fL (ref 80.0–100.0)
Monocytes Absolute: 0.5 10*3/uL (ref 0.1–1.0)
Monocytes Relative: 5 %
Neutro Abs: 8.2 10*3/uL — ABNORMAL HIGH (ref 1.7–7.7)
Neutrophils Relative %: 80 %
Platelets: 272 10*3/uL (ref 150–400)
RBC: 4.46 MIL/uL (ref 3.87–5.11)
RDW: 14.1 % (ref 11.5–15.5)
WBC: 10.3 10*3/uL (ref 4.0–10.5)
nRBC: 0 % (ref 0.0–0.2)

## 2023-03-30 LAB — TROPONIN I (HIGH SENSITIVITY)
Troponin I (High Sensitivity): <2 ng/L (ref <18)
Troponin I (High Sensitivity): <2 ng/L (ref <18)

## 2023-03-30 NOTE — ED Triage Notes (Addendum)
Pt arrived via EMS, was at work started feeling shakey. States this happened previously before being on BP medications. Has since been on them and taking appropriately. BP elevated with EMS. CBG 140s

## 2023-03-30 NOTE — ED Provider Triage Note (Signed)
Emergency Medicine Provider Triage Evaluation Note  TREONA UENO , a 37 y.o. female  was evaluated in triage.  Pt complains of feeling dizzy and shaky earlier today at work in addition to left-sided chest pain and left arm tingling.  Review of Systems  Negative: Shortness of breath, alcohol use, history of seizures  Physical Exam  BP 125/80 (BP Location: Right Arm)   Pulse 68   Temp 98.7 F (37.1 C) (Oral)   Resp 17   SpO2 100%  Gen:   Awake, no distress   Resp:  Normal effort  MSK:   Moves extremities without difficulty  Neuro:  No gross deficits  Medical Decision Making  Medically screening exam initiated at 3:34 PM.  Appropriate orders placed.  Loyal Buba was informed that the remainder of the evaluation will be completed by another provider, this initial triage assessment does not replace that evaluation, and the importance of remaining in the ED until their evaluation is complete.     Halford Decamp, PA-C 03/30/23 1548

## 2023-03-30 NOTE — ED Notes (Signed)
Pt not in room.

## 2023-03-30 NOTE — ED Notes (Addendum)
Pt states she thinks what came over her was the beginning of something viral but she didn't realize it at the time because she has heart issues & she got scared. States she thinks she just needs to go home & rest. States she is starting to get a sore throat as well.

## 2023-09-28 IMAGING — CT CT HEART MORP W/ CTA COR W/ SCORE W/ CA W/CM &/OR W/O CM
4 of 7 series · 8 of 20 positions shown, 9 images · IV contrast (APPLIED)
Comparison: CT 06/06/2021.

Addendum:
CLINICAL DATA: Chest pain

EXAM:
Cardiac/Coronary CTA
TECHNIQUE: A non-contrast, gated CT scan was obtained with axial slices of 3 mm
through the heart for calcium scoring. Calcium scoring was performed
using the Agatston method. A 120 kV prospective, gated, contrast
cardiac scan was obtained. Gantry rotation speed was 250 msecs and
collimation was 0.6 mm. Two sublingual nitroglycerin tablets (0.8
mg) were given. The 3D data set was reconstructed in 5% intervals of
the 35-75% of the R-R cycle. Diastolic phases were analyzed on a
dedicated workstation using MPR, MIP, and VRT modes. The patient
received 95 cc of contrast.

[Series 6: ts syst sharp · axial · 0.36mm/px · z∈[-113,-74]mm · 2 of 296 slices shown]
[im 99/296  lung]
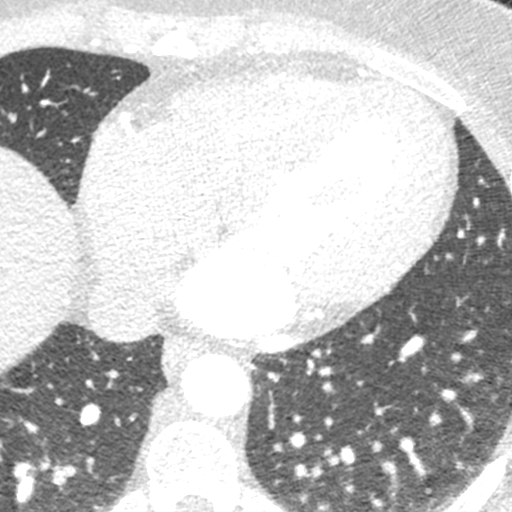
[im 197/296  lung]
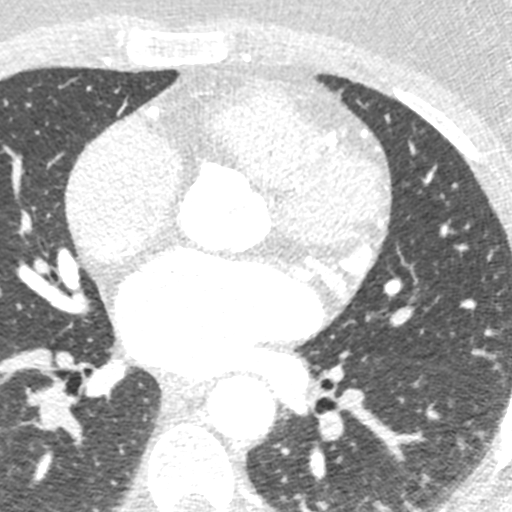

[Series 7: best syst · axial · 0.36mm/px · z∈[-113,-74]mm · 2 of 296 slices shown, 3 images]
[im 99/296  vessel]
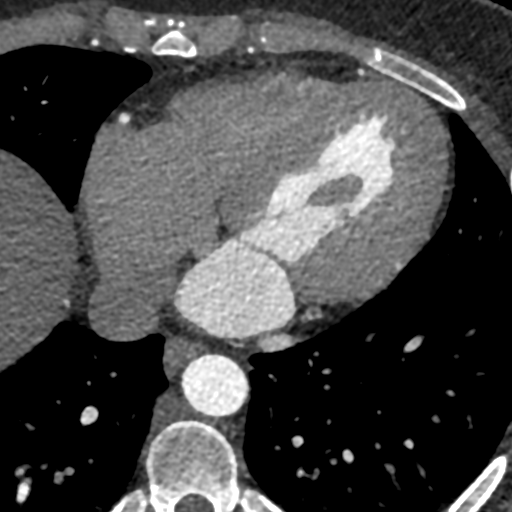
[im 99/296  lung]
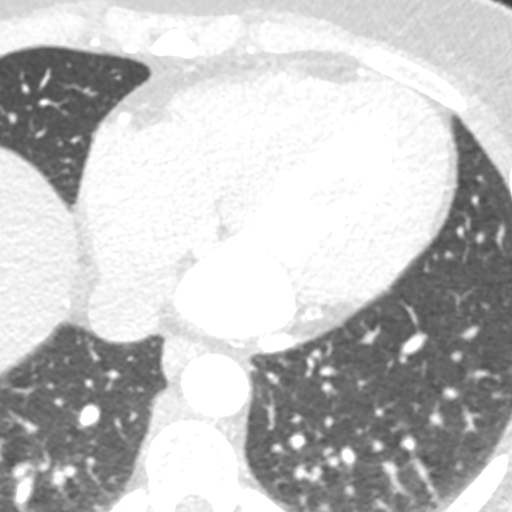
[im 197/296  vessel]
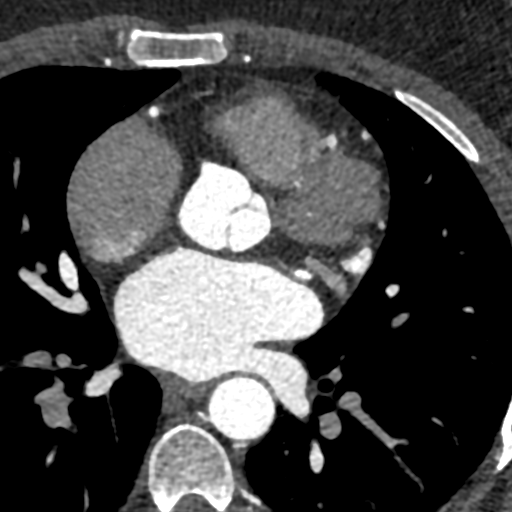

[Series 8: ts diast sharp · axial · 0.36mm/px · z∈[-113,-74]mm · 2 of 296 slices shown]
[im 99/296  lung]
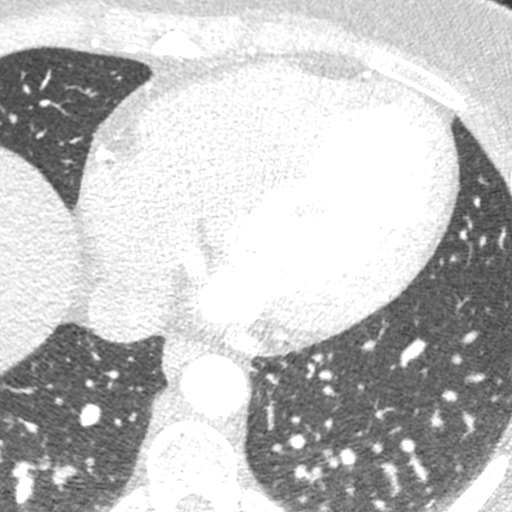
[im 197/296  lung]
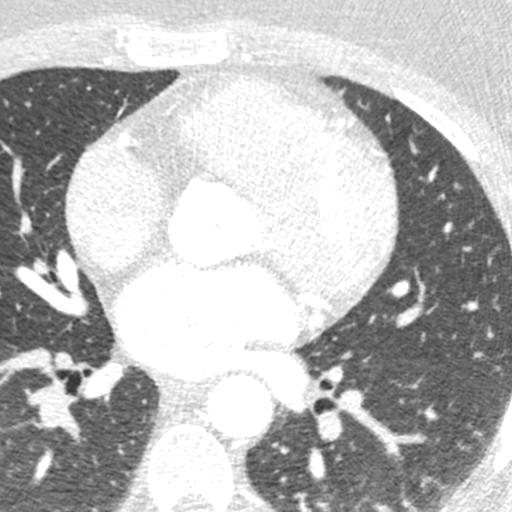

[Series 9: best diast · axial · 0.36mm/px · z∈[-113,-74]mm · 2 of 296 slices shown]
[im 99/296  vessel]
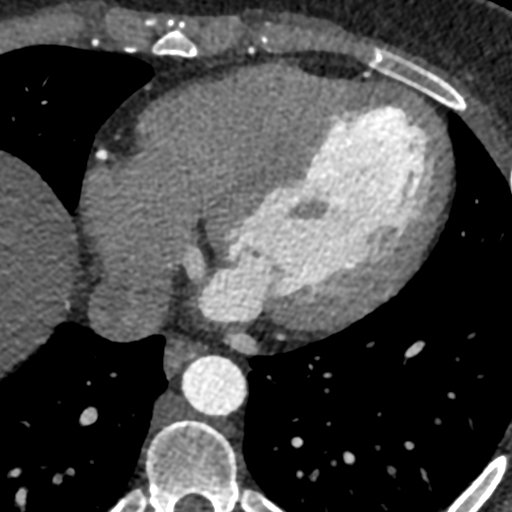
[im 197/296  vessel]
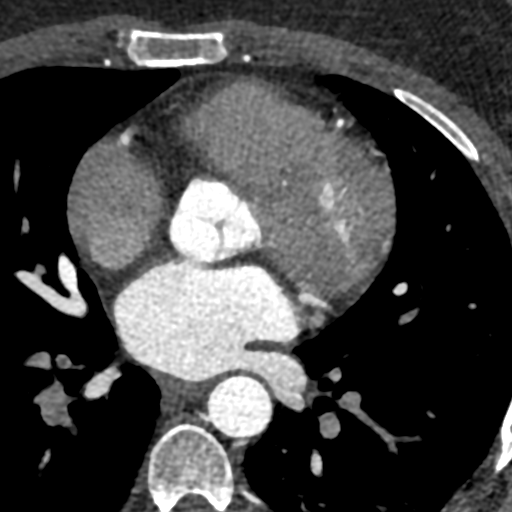

[8 of 20 positions shown; findings below may reference images not displayed]

FINDINGS: Image quality: Excellent.

Noise artifact is: Limited.

Coronary Arteries:  Normal coronary origin.  Right dominance.

Left main: The left main is a large caliber vessel with a normal
take off from the left coronary cusp that bifurcates to form a left
anterior descending artery and a left circumflex artery. There is no
plaque or stenosis.

Left anterior descending artery: The LAD is patent without evidence
of plaque or stenosis. The LAD gives off 2 patent diagonal branches.

Left circumflex artery: The LCX is non-dominant and patent with no
evidence of plaque or stenosis. The LCX gives off 2 patent obtuse
marginal branches.

Right coronary artery: The RCA is dominant with normal take off from
the right coronary cusp. There is no evidence of plaque or stenosis.
The RCA terminates as a PDA and right posterolateral branch without
evidence of plaque or stenosis.

Right Atrium: Right atrial size is within normal limits.

Right Ventricle: The right ventricular cavity is within normal
limits.

Left Atrium: Left atrial size is normal in size with no left atrial
appendage filling defect.

Left Ventricle: The ventricular cavity size is within normal limits.
There are no stigmata of prior infarction. There is no abnormal
filling defect.

Pulmonary arteries: Normal in size without proximal filling defect.

Pulmonary veins: Normal pulmonary venous drainage.

Pericardium: Normal thickness with no significant effusion or
calcium present.

Cardiac valves: The aortic valve is trileaflet without significant
calcification. The mitral valve is normal structure without
significant calcification.

Aorta: Normal caliber with no significant disease.

Extra-cardiac findings: See attached radiology report for
non-cardiac structures.
IMPRESSION: 1. Coronary calcium score of 0. This was 0 percentile for age-, sex,
and race-matched controls.

2.  Normal coronary origin with right dominance.

3.  Normal coronary arteries.  CAD RADS 0.

4.  Consider non atherosclerotic causes of chest pain.

RECOMMENDATIONS:
1. CAD-RADS 0: No evidence of CAD (0%). Consider non-atherosclerotic
causes of chest pain.

2. CAD-RADS 1: Minimal non-obstructive CAD (0-24%). Consider
non-atherosclerotic causes of chest pain. Consider preventive
therapy and risk factor modification.

3. CAD-RADS 2: Mild non-obstructive CAD (25-49%). Consider
non-atherosclerotic causes of chest pain. Consider preventive
therapy and risk factor modification.

4. CAD-RADS 3: Moderate stenosis. Consider symptom-guided
anti-ischemic pharmacotherapy as well as risk factor modification
per guideline directed care. Additional analysis with CT FFR will be
submitted.

5. CAD-RADS 4: Severe stenosis. (70-99% or > 50% left main). Cardiac
catheterization or CT FFR is recommended. Consider symptom-guided
anti-ischemic pharmacotherapy as well as risk factor modification
per guideline directed care. Invasive coronary angiography
recommended with revascularization per published guideline
statements.

6. CAD-RADS 5: Total coronary occlusion (100%). Consider cardiac
catheterization or viability assessment. Consider symptom-guided
anti-ischemic pharmacotherapy as well as risk factor modification
per guideline directed care.

7. CAD-RADS N: Non-diagnostic study. Obstructive CAD can't be
excluded. Alternative evaluation is recommended.

EXAM:
OVER-READ INTERPRETATION  CT CHEST

The following report is an over-read performed by radiologist Dr.
over-read does not include interpretation of cardiac or coronary
anatomy or pathology. The coronary CTA interpretation by the
cardiologist is attached.
FINDINGS: Mediastinum/nodes: No mediastinal mass or adenopathy identified.

Lungs/pleura: No pleural effusion, airspace consolidation,
atelectasis or pneumothorax.

Upper abdomen: No acute abnormality.

Musculoskeletal: No acute or suspicious osseous findings.
IMPRESSION: Negative over-read.

*** End of Addendum ***
FINDINGS: Image quality: Excellent.

Noise artifact is: Limited.

Coronary Arteries:  Normal coronary origin.  Right dominance.

Left main: The left main is a large caliber vessel with a normal
take off from the left coronary cusp that bifurcates to form a left
anterior descending artery and a left circumflex artery. There is no
plaque or stenosis.

Left anterior descending artery: The LAD is patent without evidence
of plaque or stenosis. The LAD gives off 2 patent diagonal branches.

Left circumflex artery: The LCX is non-dominant and patent with no
evidence of plaque or stenosis. The LCX gives off 2 patent obtuse
marginal branches.

Right coronary artery: The RCA is dominant with normal take off from
the right coronary cusp. There is no evidence of plaque or stenosis.
The RCA terminates as a PDA and right posterolateral branch without
evidence of plaque or stenosis.

Right Atrium: Right atrial size is within normal limits.

Right Ventricle: The right ventricular cavity is within normal
limits.

Left Atrium: Left atrial size is normal in size with no left atrial
appendage filling defect.

Left Ventricle: The ventricular cavity size is within normal limits.
There are no stigmata of prior infarction. There is no abnormal
filling defect.

Pulmonary arteries: Normal in size without proximal filling defect.

Pulmonary veins: Normal pulmonary venous drainage.

Pericardium: Normal thickness with no significant effusion or
calcium present.

Cardiac valves: The aortic valve is trileaflet without significant
calcification. The mitral valve is normal structure without
significant calcification.

Aorta: Normal caliber with no significant disease.

Extra-cardiac findings: See attached radiology report for
non-cardiac structures.
IMPRESSION: 1. Coronary calcium score of 0. This was 0 percentile for age-, sex,
and race-matched controls.

2.  Normal coronary origin with right dominance.

3.  Normal coronary arteries.  CAD RADS 0.

4.  Consider non atherosclerotic causes of chest pain.

RECOMMENDATIONS:
1. CAD-RADS 0: No evidence of CAD (0%). Consider non-atherosclerotic
causes of chest pain.

2. CAD-RADS 1: Minimal non-obstructive CAD (0-24%). Consider
non-atherosclerotic causes of chest pain. Consider preventive
therapy and risk factor modification.

3. CAD-RADS 2: Mild non-obstructive CAD (25-49%). Consider
non-atherosclerotic causes of chest pain. Consider preventive
therapy and risk factor modification.

4. CAD-RADS 3: Moderate stenosis. Consider symptom-guided
anti-ischemic pharmacotherapy as well as risk factor modification
per guideline directed care. Additional analysis with CT FFR will be
submitted.

5. CAD-RADS 4: Severe stenosis. (70-99% or > 50% left main). Cardiac
catheterization or CT FFR is recommended. Consider symptom-guided
anti-ischemic pharmacotherapy as well as risk factor modification
per guideline directed care. Invasive coronary angiography
recommended with revascularization per published guideline
statements.

6. CAD-RADS 5: Total coronary occlusion (100%). Consider cardiac
catheterization or viability assessment. Consider symptom-guided
anti-ischemic pharmacotherapy as well as risk factor modification
per guideline directed care.

7. CAD-RADS N: Non-diagnostic study. Obstructive CAD can't be
excluded. Alternative evaluation is recommended.

## 2024-01-18 ENCOUNTER — Ambulatory Visit
Admission: EM | Admit: 2024-01-18 | Discharge: 2024-01-18 | Disposition: A | Discharge status: Home / Self Care | Attending: Family Medicine | Admitting: Family Medicine

## 2024-01-18 DIAGNOSIS — Z720 Tobacco use: Secondary | ICD-10-CM | POA: Insufficient documentation

## 2024-01-18 DIAGNOSIS — J029 Acute pharyngitis, unspecified: Secondary | ICD-10-CM | POA: Diagnosis not present

## 2024-01-18 LAB — POCT RAPID STREP A (OFFICE): Rapid Strep A Screen: NEGATIVE

## 2024-01-18 MED ORDER — IBUPROFEN 600 MG PO TABS: 600.0000 mg | ORAL_TABLET | Freq: Three times a day (TID) | ORAL | 0 refills | Status: AC | PRN

## 2024-01-18 NOTE — ED Provider Notes (Signed)
 [facility]    CSN: [id number] Arrival date & time: 01/18/24  1456      History   Chief Complaint Chief Complaint  Patient presents with   Sore Throat   Generalized Body Aches   Nasal Congestion    HPI Jocelyn Garcia is a 38 y.o. female.    Sore Throat  Here for sore throat that is been going on since yesterday.  She has had some chills but has not documented any fever.  She has had some nasal congestion and just a little bit of dry cough, more due to dry throat.  She has not gotten take any medications for the symptoms.  She is allergic to lisinopril and latex  Last menstrual cycle was a while ago as she has an IUD.  Past Medical History:  Diagnosis Date   Anemia    Anxiety    Asthma    exercise induced   Chronic sinusitis with recurrent bronchitis    Related to smoking   Crohn's disease of small intestine with intestinal obstruction (HCC) 06/26/2017   Hypertension 04/04/2021   It was high every time checked for a couple years but each time i was in pain or had something going on that made it expectedly high   Kidney stones    Migraines    Tobacco use disorder, mild, in early remission, abuse 06/05/2021   She says that she quit smoking early March 2023.  She smoked about 7 years, but before that had 10-year break from smoking.  She smoked as a teenager   Urinary tract infection     Patient Active Problem List   Diagnosis Date Noted   Tobacco abuse disorder 01/18/2024   Chronic lumbosacral pain 03/07/2022   Vitamin D deficiency 03/07/2022   Mixed hyperlipidemia 08/26/2021   Gastroesophageal reflux disease without esophagitis 07/26/2021   Precordial pain 07/05/2021   Family history of premature coronary artery disease 07/05/2021   IBS (irritable bowel syndrome) 07/05/2021   Heart palpitations 06/05/2021   High blood pressure 06/04/2021   Obesity with body mass index of 30.0-39.9 09/25/2012    Past Surgical History:  Procedure  Laterality Date   CT CTA CORONARY W/CA SCORE W/CM &/OR WO/CM  07/16/2011   Calcium  score 0.  Normal coronaries.  Minimal to no evidence of CAD.  Normal cavity sizes   WISDOM TOOTH EXTRACTION     Zio patch monitor  07/2021   Predominant sinus rhythm.  Heart rate range 55-160 bpm, average 89 bpm.  Rare isolated PACs and PVCs with some couplets.  Somewhat symptomatic.  1 short atrial run heart rate range 93 to 122 bpm.    OB History     Gravida  2   Para  2   Term  2   Preterm      AB      Living  2      SAB      IAB      Ectopic      Multiple      Live Births  2            Home Medications    Prior to Admission medications   Medication Sig Start Date End Date Taking? Authorizing Provider  amLODipine  (NORVASC ) 10 MG tablet Take 10 mg by mouth daily. 06/18/21  Yes [provider]  hydrochlorothiazide (HYDRODIURIL) 25 MG tablet Take 25 mg by mouth daily. 07/15/21  Yes [provider]  hydrOXYzine  (ATARAX ) 25 MG tablet  Take 25 mg by mouth every 8 (eight) hours as needed. 08/10/23  Yes [provider]  ibuprofen  (ADVIL ) 600 MG tablet Take 1 tablet (600 mg total) by mouth every 8 (eight) hours as needed (pain). 01/18/24  Yes Vonna Sharlet POUR, MD  levonorgestrel  (MIRENA ) 20 MCG/DAY IUD 1 each by Intrauterine route once.   Yes [provider]  polyethylene glycol powder (GLYCOLAX /MIRALAX ) 17 GM/SCOOP powder Take 17 g by mouth. 09/11/23 09/10/24 Yes [provider]  potassium chloride SA (KLOR-CON M) 20 MEQ tablet Take 20 mEq by mouth daily. 06/13/23  Yes [provider]  PROCTO-MED HC 2.5 % rectal cream Place 1 Application rectally 2 (two) times daily. 09/11/23  Yes [provider]  famotidine (PEPCID) 20 MG tablet Take 20 mg by mouth daily.    [provider]  rosuvastatin  (CRESTOR ) 20 MG tablet Take 1 tablet (20 mg total) by mouth daily. 08/23/21   Anner Alm ORN, MD  Vitamin D, Ergocalciferol, (DRISDOL)  1.25 MG (50000 UNIT) CAPS capsule Take 50,000 Units by mouth once a week. 06/24/21   [provider]    Family History Family History  Problem Relation Age of Onset   Heart failure Mother 59   Hypertension Mother    Diabetes Mother    Endometriosis Mother    Kidney disease Mother        stagae 3 renal failure   Coronary artery disease Mother 26   Atrial fibrillation Mother    Arrhythmia Mother    Heart disease Mother    Hypertension Father    Heart attack Brother 41   Coronary artery disease Brother    Diabetes Mellitus II Brother    Heart disease Maternal Grandmother        She had a pacemaker   Stroke Maternal Grandmother    Hypertension Maternal Grandmother    Clotting disorder Maternal Grandmother    Arrhythmia Maternal Grandmother    Cancer Maternal Grandfather        type unknown   Cancer Paternal Grandmother        type unknown   Heart disease Other        Several other members of her mother's family had early onset heart disease.   Arrhythmia Maternal Aunt    Heart attack Brother    Hypertension Brother    Anesthesia problems Neg Hx     Social History Social History   Tobacco Use   Smoking status: Former    Current packs/day: 0.00    Average packs/day: 0.5 packs/day for 10.0 years (5.0 ttl pk-yrs)    Types: Cigarettes    Start date: 06/06/2011    Quit date: 06/05/2021    Years since quitting: 2.6   Smokeless tobacco: Never  Substance Use Topics   Alcohol use: No   Drug use: No    Comment: over a year ago     Allergies   Latex and Lisinopril   Review of Systems Review of Systems   Physical Exam Triage Vital Signs ED Triage Vitals  Encounter Vitals Group     BP 01/18/24 1508 117/80     Girls Systolic BP Percentile --      Girls Diastolic BP Percentile --      Boys Systolic BP Percentile --      Boys Diastolic BP Percentile --      Pulse Rate 01/18/24 1508 97     Resp 01/18/24 1508 18     Temp 01/18/24 1508 98.4 F (36.9  C)      Temp Source 01/18/24 1508 Oral     SpO2 01/18/24 1508 97 %     Weight 01/18/24 1506 193 lb (87.5 kg)     Height 01/18/24 1506 5' 5 (1.651 m)     Head Circumference --      Peak Flow --      Pain Score 01/18/24 1505 8     Pain Loc --      Pain Education --      Exclude from Growth Chart --    No data found.  Updated Vital Signs BP 117/80 (BP Location: Left Arm)   Pulse 97   Temp 98.4 F (36.9 C) (Oral)   Resp 18   Ht 5' 5 (1.651 m)   Wt 87.5 kg   LMP  (LMP Unknown)   SpO2 97%   BMI 32.12 kg/m   Visual Acuity Right Eye Distance:   Left Eye Distance:   Bilateral Distance:    Right Eye Near:   Left Eye Near:    Bilateral Near:     Physical Exam Vitals reviewed.  Constitutional:      General: She is not in acute distress.    Appearance: She is not toxic-appearing.  HENT:     Right Ear: Tympanic membrane and ear canal normal.     Left Ear: Tympanic membrane and ear canal normal.     Nose: Congestion present.     Mouth/Throat:     Mouth: Mucous membranes are moist.     Comments: Tonsils are hypertrophied 2+ with erythema and some white exudate in the crypts.  Uvula is in the midline and there is no swelling of the soft palate. Eyes:     Extraocular Movements: Extraocular movements intact.     Conjunctiva/sclera: Conjunctivae normal.     Pupils: Pupils are equal, round, and reactive to light.  Cardiovascular:     Rate and Rhythm: Normal rate and regular rhythm.     Heart sounds: No murmur heard. Pulmonary:     Effort: Pulmonary effort is normal. No respiratory distress.     Breath sounds: No wheezing, rhonchi or rales.  Chest:     Chest wall: No tenderness.  Musculoskeletal:     Cervical back: Neck supple.  Lymphadenopathy:     Cervical: No cervical adenopathy.  Skin:    Capillary Refill: Capillary refill takes less than 2 seconds.     Coloration: Skin is not jaundiced or pale.  Neurological:     General: No focal deficit present.     Mental Status: She  is alert and oriented to person, place, and time.  Psychiatric:        Behavior: Behavior normal.      UC Treatments / Results  Labs (all labs ordered are listed, but only abnormal results are displayed) Labs Reviewed  POCT RAPID STREP A (OFFICE) - Normal  CULTURE, GROUP A STREP [facility])    EKG   Radiology No results found.  Procedures Procedures (including critical care time)  Medications Ordered in UC Medications - No data to display  Initial Impression / Assessment and Plan / UC Course  I have reviewed the triage vital signs and the nursing notes.  Pertinent labs & imaging results that were available during my care of the patient were reviewed by me and considered in my medical decision making (see chart for details).     Rapid strep is negative.  Throat culture is sent and we will notify  and treat protocol if that is positive  Ibuprofen  is sent in for the pain. Final Clinical Impressions(s) / UC Diagnoses   Final diagnoses:  Acute pharyngitis, unspecified etiology     Discharge Instructions      Your strep test is negative.  Culture of the throat will be sent, and staff will notify you if that is in turn positive.  Take ibuprofen  600 mg--1 tab every 8 hours as needed for pain.       ED Prescriptions     Medication Sig Dispense Auth. Provider   ibuprofen  (ADVIL ) 600 MG tablet Take 1 tablet (600 mg total) by mouth every 8 (eight) hours as needed (pain). 15 tablet Kynsleigh Westendorf K, MD      PDMP not reviewed this encounter.   Vonna Sharlet POUR, MD 01/18/24 [facility]

## 2024-01-18 NOTE — ED Triage Notes (Signed)
 Patient reports symptoms she suspects may be strep throat. Initial onset included body aches beginning Tuesday night following a 14-hour workday, initially attributed to fatigue. Symptoms have persisted and now include congestion and occasional dry cough. Denies runny nose, rash, or known fever.

## 2024-01-18 NOTE — Discharge Instructions (Signed)
Your strep test is negative.  Culture of the throat will be sent, and staff will notify you if that is in turn positive.  Take ibuprofen 600 mg--1 tab every 8 hours as needed for pain.

## 2024-01-21 LAB — CULTURE, GROUP A STREP (THRC)

## 2024-04-26 ENCOUNTER — Ambulatory Visit: Admission: RE | Admit: 2024-04-26 | Source: Ambulatory Visit

## 2024-04-26 ENCOUNTER — Other Ambulatory Visit: Payer: Self-pay | Admitting: Family Medicine

## 2024-04-26 DIAGNOSIS — M25561 Pain in right knee: Secondary | ICD-10-CM
